# Patient Record
Sex: Male | Born: 1965
Health system: Southern US, Community
[De-identification: ages and names within clinical notes are randomized; demographics above are authoritative.]

## PROBLEM LIST (undated history)

## (undated) DIAGNOSIS — E785 Hyperlipidemia, unspecified: Secondary | ICD-10-CM

## (undated) DIAGNOSIS — I714 Abdominal aortic aneurysm, without rupture, unspecified: Secondary | ICD-10-CM

## (undated) DIAGNOSIS — I4510 Unspecified right bundle-branch block: Secondary | ICD-10-CM

## (undated) DIAGNOSIS — R011 Cardiac murmur, unspecified: Secondary | ICD-10-CM

## (undated) DIAGNOSIS — I10 Essential (primary) hypertension: Secondary | ICD-10-CM

## (undated) DIAGNOSIS — N433 Hydrocele, unspecified: Secondary | ICD-10-CM

## (undated) HISTORY — PX: CARDIOVASCULAR STRESS TEST: SHX262

## (undated) HISTORY — PX: TRANSTHORACIC ECHOCARDIOGRAM: SHX275

---

## 1998-11-16 ENCOUNTER — Emergency Department (HOSPITAL_COMMUNITY): Admission: EM | Admit: 1998-11-16 | Discharge: 1998-11-16 | Payer: Self-pay | Admitting: *Deleted

## 2014-08-10 ENCOUNTER — Emergency Department (HOSPITAL_COMMUNITY): Payer: No Typology Code available for payment source

## 2014-08-10 ENCOUNTER — Encounter (HOSPITAL_COMMUNITY): Payer: Self-pay | Admitting: Family Medicine

## 2014-08-10 ENCOUNTER — Emergency Department (HOSPITAL_COMMUNITY)
Admission: EM | Admit: 2014-08-10 | Discharge: 2014-08-10 | Disposition: A | Discharge status: Home / Self Care | Payer: No Typology Code available for payment source | Attending: Emergency Medicine | Admitting: Emergency Medicine

## 2014-08-10 DIAGNOSIS — N433 Hydrocele, unspecified: Secondary | ICD-10-CM | POA: Insufficient documentation

## 2014-08-10 DIAGNOSIS — N50819 Testicular pain, unspecified: Secondary | ICD-10-CM

## 2014-08-10 DIAGNOSIS — N508 Other specified disorders of male genital organs: Secondary | ICD-10-CM | POA: Diagnosis present

## 2014-08-10 DIAGNOSIS — F419 Anxiety disorder, unspecified: Secondary | ICD-10-CM | POA: Insufficient documentation

## 2014-08-10 DIAGNOSIS — Z720 Tobacco use: Secondary | ICD-10-CM | POA: Diagnosis not present

## 2014-08-10 LAB — URINALYSIS, ROUTINE W REFLEX MICROSCOPIC
Bilirubin Urine: NEGATIVE
Glucose, UA: NEGATIVE mg/dL
Hgb urine dipstick: NEGATIVE
Ketones, ur: NEGATIVE mg/dL
Leukocytes, UA: NEGATIVE
Nitrite: NEGATIVE
Protein, ur: NEGATIVE mg/dL
Specific Gravity, Urine: 1.016 (ref 1.005–1.030)
Urobilinogen, UA: 0.2 mg/dL (ref 0.0–1.0)
pH: 5 (ref 5.0–8.0)

## 2014-08-10 LAB — I-STAT CHEM 8, ED
BUN: 11 mg/dL (ref 6–20)
Calcium, Ion: 1.24 mmol/L — ABNORMAL HIGH (ref 1.12–1.23)
Chloride: 105 mmol/L (ref 101–111)
Creatinine, Ser: 0.9 mg/dL (ref 0.61–1.24)
Glucose, Bld: 139 mg/dL — ABNORMAL HIGH (ref 65–99)
HCT: 50 % (ref 39.0–52.0)
Hemoglobin: 17 g/dL (ref 13.0–17.0)
Potassium: 4 mmol/L (ref 3.5–5.1)
Sodium: 141 mmol/L (ref 135–145)
TCO2: 21 mmol/L (ref 0–100)

## 2014-08-10 IMAGING — US US SCROTUM
1 series · 14 of 25 positions shown · non-contrast
Comparison: None.

CLINICAL DATA: Left testicular pain and swelling for 4 months.

EXAM:
SCROTAL ULTRASOUND
DOPPLER ULTRASOUND OF THE TESTICLES
TECHNIQUE: Complete ultrasound examination of the testicles, epididymis, and
other scrotal structures was performed. Color and spectral Doppler
ultrasound were also utilized to evaluate blood flow to the
testicles.

[Series 1: us scrotum · 0.06mm/px · 69 acquisitions, 14 frames shown]
[im 1/69]
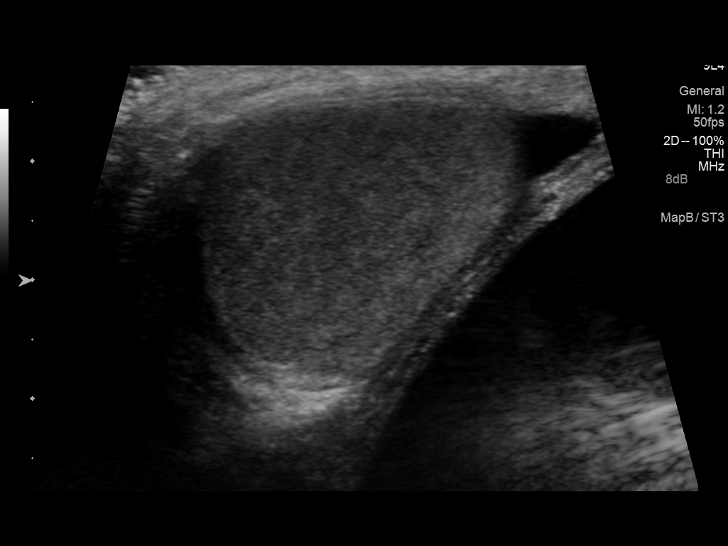
[im 6/69]
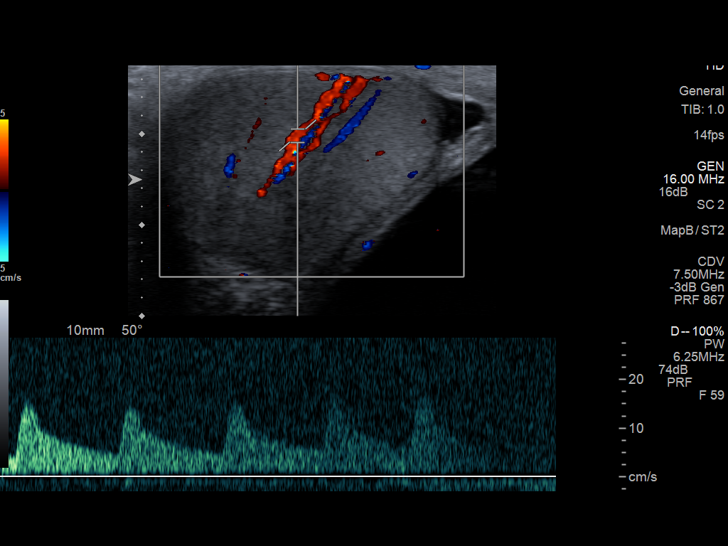
[im 12/69]
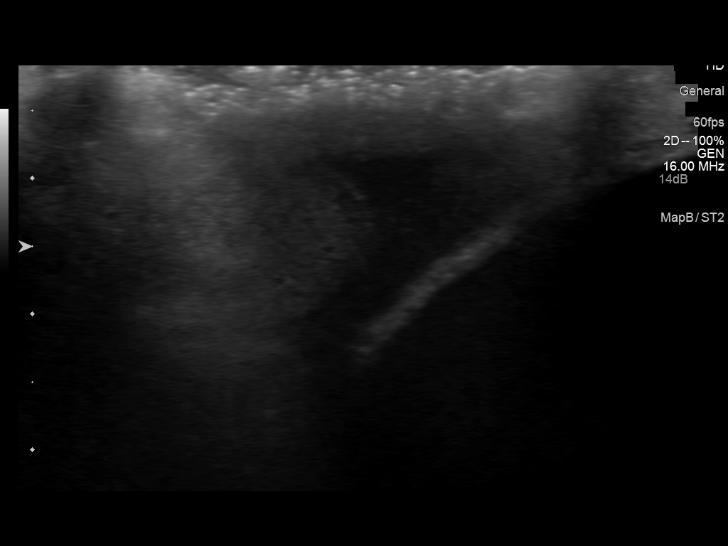
[im 18/69]
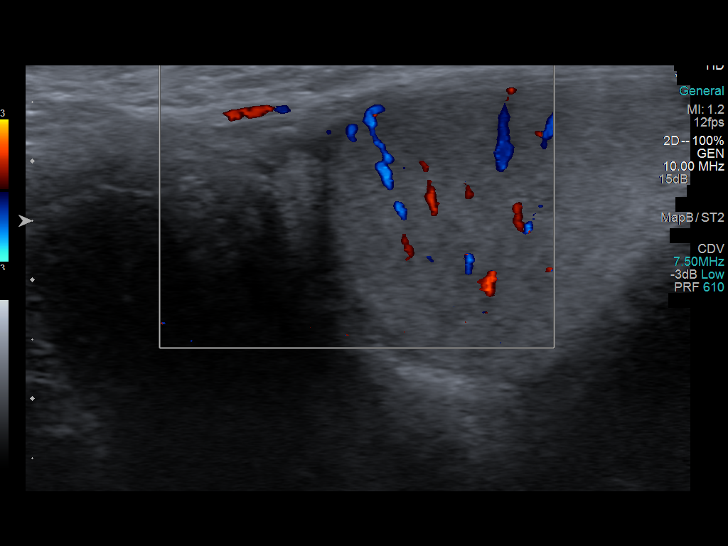
[im 23/69]
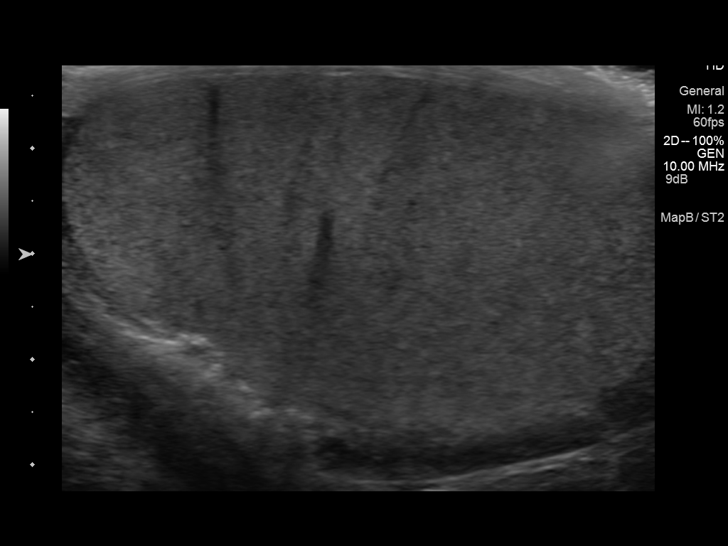
[im 26/69]
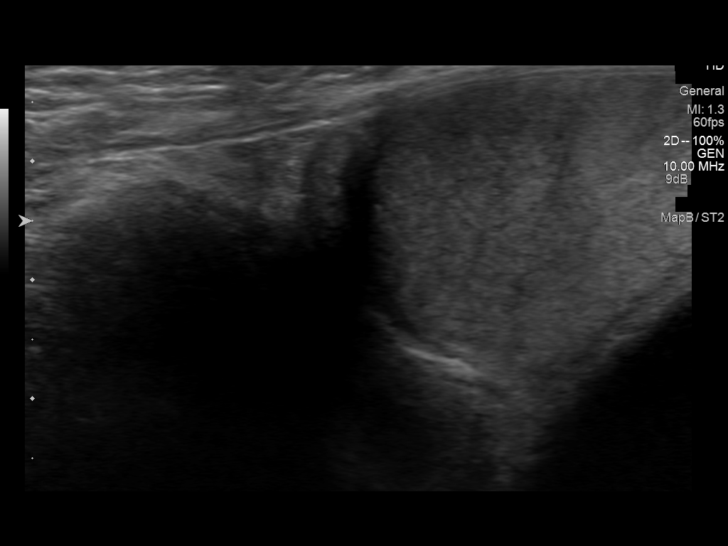
[im 32/69]
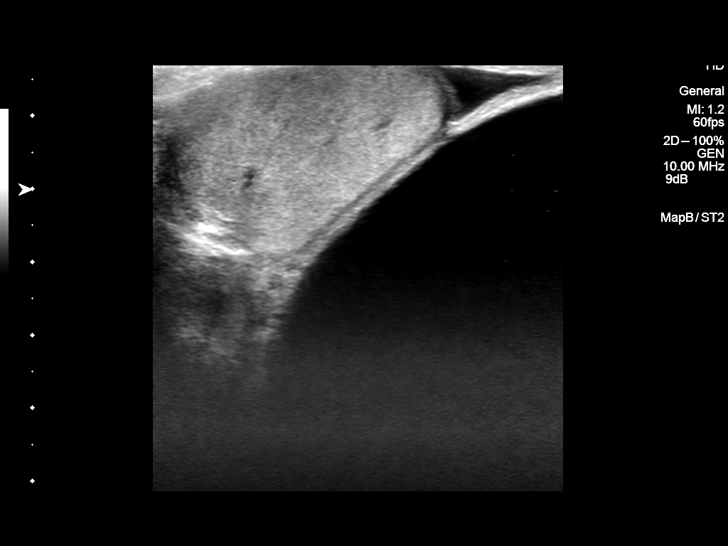
[im 37/69]
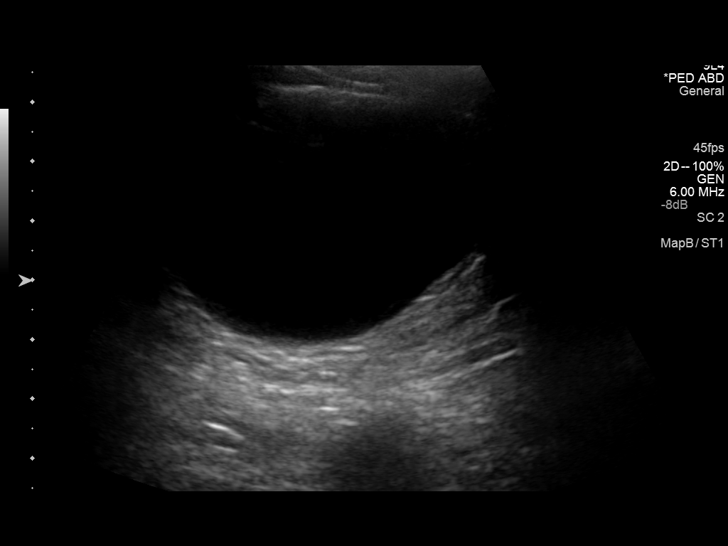
[im 43/69]
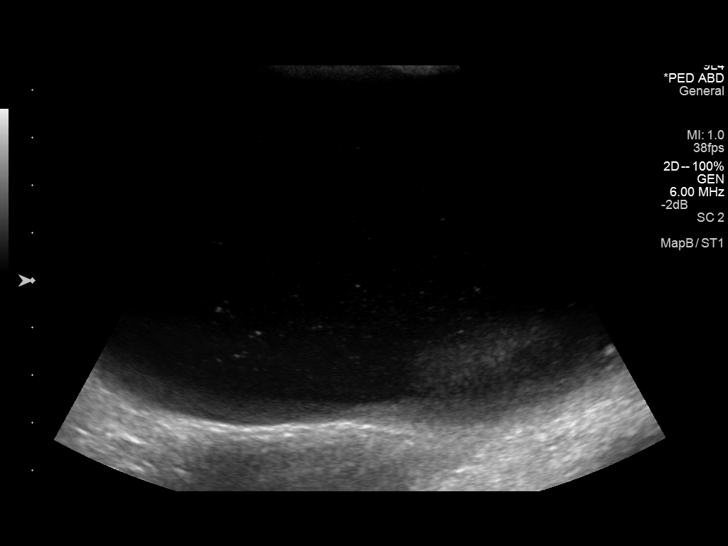
[im 46/69]
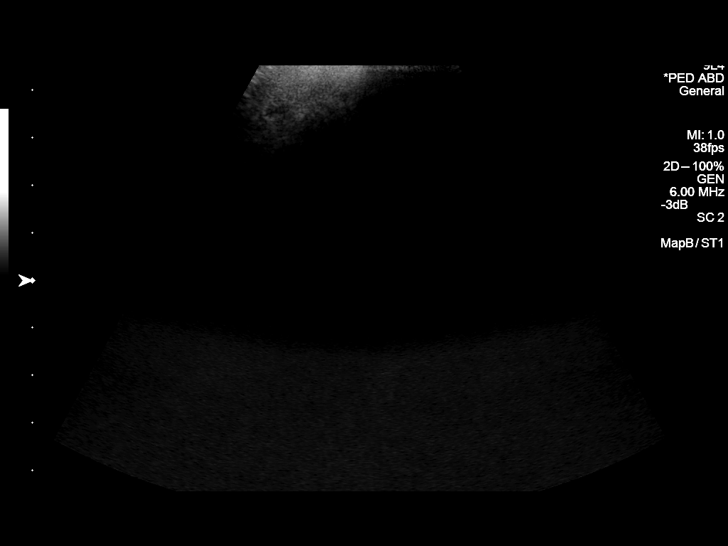
[im 52/69]
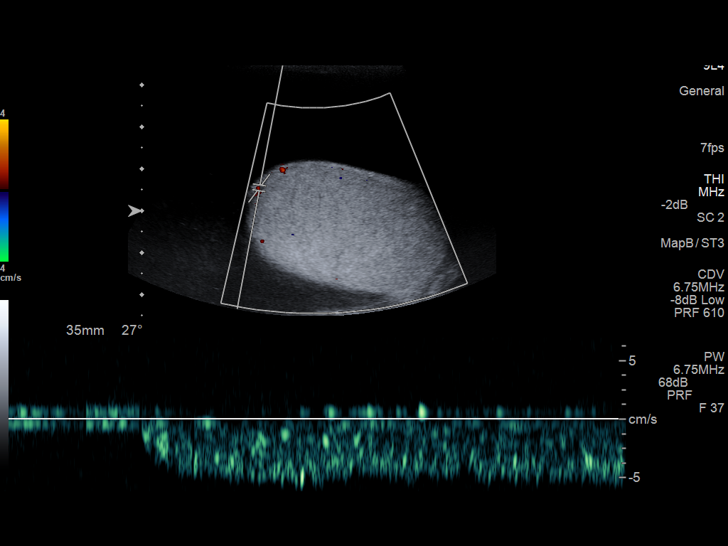
[im 57/69]
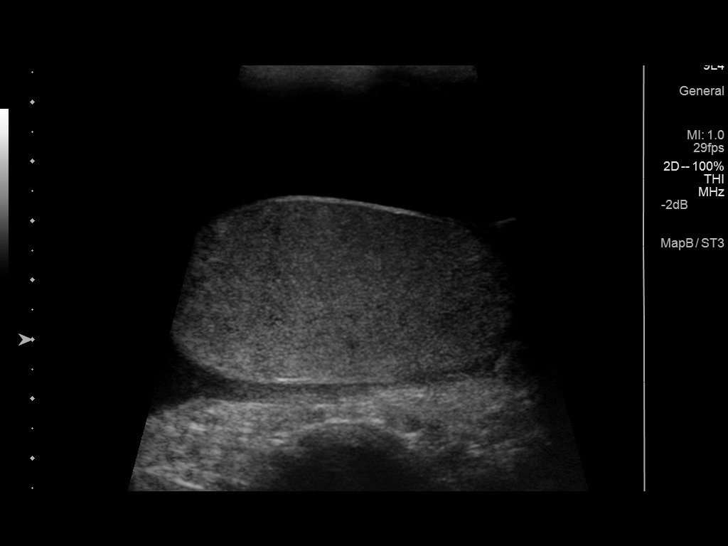
[im 63/69]
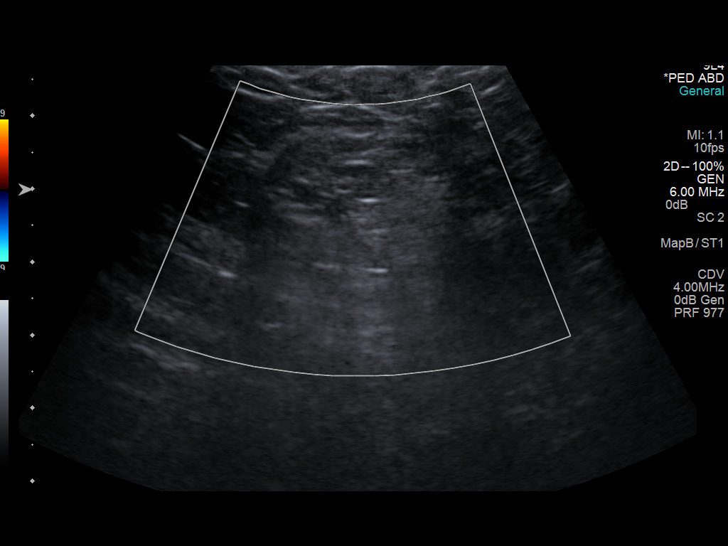
[im 69/69]
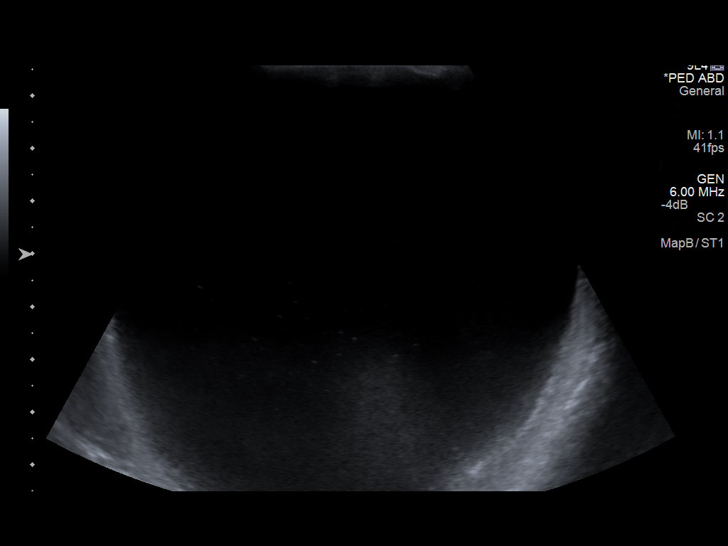

[14 of 25 positions shown; findings below may reference images not displayed]

FINDINGS: Right testicle

Measurements: 5.5 x 3.5 x 3 cm.. No mass or microlithiasis
visualized. Positive arterial and venous flow are identified.

Left testicle

Measurements: 6 x 3.3 x 3.4 cm.. No mass or microlithiasis
visualized. Positive arterial and venous flow are identified.

Right epididymis: There is a 4 mm cyst in the right epididymal head.

Left epididymis:  Not well seen.

Hydrocele: There is a large left hydrocele with minimal internal
echoes.

Varicocele:  None visualized.

Pulsed Doppler interrogation of both testes demonstrates normal low
resistance arterial and venous waveforms bilaterally.
IMPRESSION: Normal bilateral testis. There is no ultrasound evidence of
testicular torsion.

Large left hydrocele with minimal internal echoes.

## 2014-08-10 NOTE — ED Provider Notes (Signed)
CSN: [id number]     Arrival date & time 08/10/14  1020 History   First MD Initiated Contact with Patient 08/10/14 1025     Chief Complaint  Patient presents with  . Testicle Pain     (Consider location/radiation/quality/duration/timing/severity/associated sxs/prior Treatment) HPI Pt is a 49yo male presenting to ED with reports of several month long hx of left testicular swelling.  Pt states he has not f/u with a medical provider as he does not have a PCP and has been too nervous to come to ED.  Now, pt states he has mild aching dull pain with movement of left testicle.  Scrotal swelling is constant, and has gradually worsened. Pt states he thought it would get better but nothing seems to make symptoms better or worse.  Swelling does not improve when pt lies flat.  Pt also reports having dark urine in the morning but states his urine clears up by the end of the day.  Denies pain with urination. Denies penile pain or discharge.  Pt states he works in [employer] but does not recall any injury to his groin.  States he does drink a lot of water. Denies hx of DM.   History reviewed. No pertinent past medical history. History reviewed. No pertinent past surgical history. History reviewed. No pertinent family history. History  Substance Use Topics  . Smoking status: Current Every Day Smoker  . Smokeless tobacco: Not on file  . Alcohol Use: Yes    Review of Systems  Constitutional: Negative for fever, chills, appetite change, fatigue and unexpected weight change.  Respiratory: Negative for cough and shortness of breath.   Cardiovascular: Negative for chest pain, palpitations and leg swelling.  Gastrointestinal: Negative for nausea, vomiting, abdominal pain and diarrhea.  Genitourinary: Positive for hematuria ("real dark urine"), scrotal swelling and testicular pain. Negative for dysuria, urgency, frequency, flank pain, discharge, penile swelling and penile pain.  Musculoskeletal: Negative for  myalgias and back pain.  All other systems reviewed and are negative.     Allergies  Review of patient's allergies indicates no known allergies.  Home Medications   Prior to Admission medications   Medication Sig Start Date End Date Taking? Authorizing Provider  acetaminophen (TYLENOL) 500 MG tablet Take 1,000 mg by mouth every 6 (six) hours as needed for mild pain or moderate pain.   Yes Historical Provider, MD   BP 167/104 mmHg  Pulse 58  Temp(Src) 98 F (36.7 C)  Resp 18  Ht 5\' 11"  (1.803 m)  Wt 228 lb (103.42 kg)  BMI 31.81 kg/m2  SpO2 98% Physical Exam  Constitutional: He is oriented to person, place, and time. He appears well-developed and well-nourished.  Pt sitting on side of exam bed, appears mildly anxious but otherwise well, non-toxic appearing   HENT:  Head: Normocephalic and atraumatic.  Eyes: EOM are normal.  Neck: Normal range of motion.  Cardiovascular: Normal rate.   Pulmonary/Chest: Effort normal. No respiratory distress.  Abdominal: Soft. He exhibits no distension. There is no tenderness.  Genitourinary:  Chaperoned exam.  Softball sized scrotal mass, penis covered due to extent of swelling. Skin around penis is able to be retracted, no phimosis or paraphymosis. No penile discharge or erythema.  Scrotal mass is firm, mild tenderness. No erythema or warmth of skin overlying testicles.   Musculoskeletal: Normal range of motion.  Neurological: He is alert and oriented to person, place, and time.  Skin: Skin is warm and dry.  Psychiatric: His behavior is normal. His mood  appears anxious.  Nursing note and vitals reviewed.   ED Course  Procedures (including critical care time) Labs Review Labs Reviewed  I-STAT CHEM 8, ED - Abnormal; Notable for the following:    Glucose, Bld 139 (*)    Calcium, Ion 1.24 (*)    All other components within normal limits  URINE CULTURE  URINALYSIS, ROUTINE W REFLEX MICROSCOPIC  GC/CHLAMYDIA PROBE AMP ([place])     Imaging Review US Scrotum  08/10/2014   CLINICAL DATA:  Left testicular pain and swelling for 4 months.  EXAM: SCROTAL ULTRASOUND  DOPPLER ULTRASOUND OF THE TESTICLES  TECHNIQUE: Complete ultrasound examination of the testicles, epididymis, and other scrotal structures was performed. Color and spectral Doppler ultrasound were also utilized to evaluate blood flow to the testicles.  COMPARISON:  None.  FINDINGS: Right testicle  Measurements: 5.5 x 3.5 x 3 cm. No mass or microlithiasis visualized. Positive arterial and venous flow are identified.  Left testicle  Measurements: 6 x 3.3 x 3.4 cm. No mass or microlithiasis visualized. Positive arterial and venous flow are identified.  Right epididymis: There is a 4 mm cyst in the right epididymal head.  Left epididymis:  Not well seen.  Hydrocele: There is a large left hydrocele with minimal internal echoes.  Varicocele:  None visualized.  Pulsed Doppler interrogation of both testes demonstrates normal low resistance arterial and venous waveforms bilaterally.  IMPRESSION: Normal bilateral testis. There is no ultrasound evidence of testicular torsion.  Large left hydrocele with minimal internal echoes.   Electronically Signed   By: Sherian Rein M.D.   On: 08/10/2014 12:36   [place] Art/ven Flow Abd Pelv Doppler  08/10/2014   CLINICAL DATA:  Left testicular pain and swelling for 4 months.  EXAM: SCROTAL ULTRASOUND  DOPPLER ULTRASOUND OF THE TESTICLES  TECHNIQUE: Complete ultrasound examination of the testicles, epididymis, and other scrotal structures was performed. Color and spectral Doppler ultrasound were also utilized to evaluate blood flow to the testicles.  COMPARISON:  None.  FINDINGS: Right testicle  Measurements: 5.5 x 3.5 x 3 cm. No mass or microlithiasis visualized. Positive arterial and venous flow are identified.  Left testicle  Measurements: 6 x 3.3 x 3.4 cm. No mass or microlithiasis visualized. Positive arterial and venous flow are identified.  Right  epididymis: There is a 4 mm cyst in the right epididymal head.  Left epididymis:  Not well seen.  Hydrocele: There is a large left hydrocele with minimal internal echoes.  Varicocele:  None visualized.  Pulsed Doppler interrogation of both testes demonstrates normal low resistance arterial and venous waveforms bilaterally.  IMPRESSION: Normal bilateral testis. There is no ultrasound evidence of testicular torsion.  Large left hydrocele with minimal internal echoes.   Electronically Signed   By: Sherian Rein M.D.   On: 08/10/2014 12:36     EKG Interpretation None      MDM   Final diagnoses:  Left hydrocele    Pt is a 49yo male presenting to ED with c/o gradually worsening scrotal swelling with mild pain for several months.  Pt also noted to have elevated BP of 195/112.  Pt has not f/u with a medical provider for several years. No known hx of HTN.  Pt also nervous about being in ED for current issue. Initially requested male provider but allowed this provider to complete workup as male providers with busy with critical patients.  Exam concerning for testicular cancer, significant for softball sized firm mass, mild tenderness. No erythema or warmth.  No evidence of underlying infection including cellulitis, abscess, or gangrene.  Pt does report dark urine, will get UA.  Pt denies fever, n/v/d. No hx of weight loss. Denies abdominal pain. Abdomen is soft, non-tender.  UA: unremarkable. Chem-8: unremarkable, Cr: 0.90. No evidence of ARF.  U/S: normal bilateral testis.  No ultrasound evidence of testicular torsion.  Large Left hydrocele with minimal internal echoes.  Discussed imaging with pt.  Strongly encouraged pt to call to schedule f/u appointment with Dr. Patsi Searsannenbaum, urology, for further evaluation and treatment of hydrocele. Also discussed with pt establishing PCP with [facility].  Return precautions provided. Pt verbalized understanding and agreement with tx plan.        Junius Finnerrin O'Malley,  PA-C 08/10/14 1259  Blake DivineJohn Wofford, MD 08/10/14 1319

## 2014-08-10 NOTE — ED Notes (Signed)
Pt sts left testicle swelling x a few months. sts some pain with movement.

## 2014-08-10 NOTE — Discharge Instructions (Signed)
Hydrocele, Adult Fluid can collect around the testicles. This fluid forms in a sac. This condition is called a hydrocele. The collected fluid causes swelling of the scrotum. Usually, it affects just one testicle. Most of the time, the condition does not cause pain. Sometimes, the hydrocele goes away on its own. Other times, surgery is needed to get rid of the fluid. CAUSES A hydrocele does not develop often. Different things can cause a hydrocele in a man, including:  Injury to the scrotum.  Infection.  X-ray of the area around the scrotum.  A tumor or cancer of the testicle.  Twisting of a testicle.  Decreased blood flow to the scrotum. SYMPTOMS   Swelling without pain. The hydrocele feels like a water-filled balloon.  Swelling with pain. This can occur if the hydrocele was caused by infection or twisting.  Mild discomfort in the scrotum.  The hydrocele may feel heavy.  Swelling that gets smaller when you lie down. DIAGNOSIS  Your caregiver will do a physical exam to decide if you have a hydrocele. This may include:  Asking questions about your overall health, today and in the past. Your caregiver may ask about any injuries, X-rays, or infections.  Pushing on your abdomen or asking you to change positions to see if the size of the hydrocele changes.  Shining a light through the scrotum (transillumination) to see if the fluid inside the scrotum is clear.  Blood tests and urine tests to check for infection.  Imaging studies that take pictures of the scrotum and testicles. TREATMENT  Treatment depends in part on what caused the condition. Options include:  Watchful waiting. Your caregiver checks the hydrocele every so often.  Different surgeries to drain the fluid.  A needle may be put into the scrotum to drain fluid (needle aspiration). Fluid often returns after this type of treatment.  A cut (incision) may be made in the scrotum to remove the fluid sac  (hydrocelectomy).  An incision may be made in the groin to repair a hydrocele that has contact with abdominal fluids (communicating hydrocele).  Medicines to treat an infection (antibiotics). HOME CARE INSTRUCTIONS  What you need to do at home may depend on the cause of the hydrocele and type of treatment. In general:  Take all medicine as directed by your caregiver. Follow the directions carefully.  Ask your caregiver if there is anything you should not do while you recover (activities, lifting, work, sex).  If you had surgery to repair a communicating hydrocele, recovery time may vary. Ask you caregiver about your recovery time.  Avoid heavy lifting for 4 to 6 weeks.  If you had an incision on the scrotum or groin, wash it for 2 to 3 days after surgery. Do this as long as the skin is closed and there are no gaps in the wound. Wash gently, and avoid rubbing the incision.  Keep all follow-up appointments. SEEK MEDICAL CARE IF:   Your scrotum seems to be getting larger.  The area becomes more and more uncomfortable. SEEK IMMEDIATE MEDICAL CARE IF:  You have a fever. Document Released: 09/01/2009 Document Revised: 01/02/2013 Document Reviewed: 09/01/2009 [employer] Patient Information 2015 [employer]. This information is not intended to replace advice given to you by your health care provider. Make sure you discuss any questions you have with your health care provider.  

## 2014-08-11 LAB — URINE CULTURE
Colony Count: NO GROWTH
Culture: NO GROWTH

## 2014-09-01 ENCOUNTER — Other Ambulatory Visit: Payer: Self-pay | Admitting: Urology

## 2014-09-14 ENCOUNTER — Ambulatory Visit (INDEPENDENT_AMBULATORY_CARE_PROVIDER_SITE_OTHER): Payer: No Typology Code available for payment source | Admitting: Family Medicine

## 2014-09-14 VITALS — BP 192/118 | HR 83 | Temp 98.3°F | Resp 20 | Ht 70.0 in | Wt 220.1 lb

## 2014-09-14 DIAGNOSIS — R010 Benign and innocent cardiac murmurs: Secondary | ICD-10-CM | POA: Diagnosis not present

## 2014-09-14 DIAGNOSIS — R7309 Other abnormal glucose: Secondary | ICD-10-CM

## 2014-09-14 DIAGNOSIS — Z8249 Family history of ischemic heart disease and other diseases of the circulatory system: Secondary | ICD-10-CM | POA: Diagnosis not present

## 2014-09-14 DIAGNOSIS — I10 Essential (primary) hypertension: Secondary | ICD-10-CM

## 2014-09-14 DIAGNOSIS — F1010 Alcohol abuse, uncomplicated: Secondary | ICD-10-CM

## 2014-09-14 DIAGNOSIS — Z720 Tobacco use: Secondary | ICD-10-CM | POA: Diagnosis not present

## 2014-09-14 DIAGNOSIS — R011 Cardiac murmur, unspecified: Secondary | ICD-10-CM

## 2014-09-14 DIAGNOSIS — N433 Hydrocele, unspecified: Secondary | ICD-10-CM

## 2014-09-14 LAB — LIPID PANEL
CHOL/HDL RATIO: 4.6 ratio
CHOLESTEROL: 176 mg/dL (ref 0–200)
HDL: 38 mg/dL — AB (ref >=40)
LDL CALC: 115 mg/dL — AB (ref 0–99)
Triglycerides: 115 mg/dL (ref <150)
VLDL: 23 mg/dL (ref 0–40)

## 2014-09-14 LAB — POCT GLYCOSYLATED HEMOGLOBIN (HGB A1C): HEMOGLOBIN A1C: 5.4

## 2014-09-14 LAB — COMPREHENSIVE METABOLIC PANEL
ALK PHOS: 80 U/L (ref 39–117)
ALT: 14 U/L (ref 0–53)
AST: 12 U/L (ref 0–37)
Albumin: 4 g/dL (ref 3.5–5.2)
BUN: 11 mg/dL (ref 6–23)
CHLORIDE: 107 meq/L (ref 96–112)
CO2: 22 meq/L (ref 19–32)
Calcium: 9.6 mg/dL (ref 8.4–10.5)
Creat: 0.87 mg/dL (ref 0.50–1.35)
GLUCOSE: 95 mg/dL (ref 70–99)
Potassium: 4.3 mEq/L (ref 3.5–5.3)
Sodium: 140 mEq/L (ref 135–145)
Total Bilirubin: 0.7 mg/dL (ref 0.2–1.2)
Total Protein: 7.4 g/dL (ref 6.0–8.3)

## 2014-09-14 LAB — GLUCOSE, POCT (MANUAL RESULT ENTRY): POC Glucose: 97 mg/dl (ref 70–99)

## 2014-09-14 LAB — LIPASE: Lipase: 73 U/L (ref 0–75)

## 2014-09-14 MED ORDER — HYDROCHLOROTHIAZIDE 25 MG PO TABS: 12.5000 mg | ORAL_TABLET | Freq: Every day | ORAL | Status: DC

## 2014-09-14 MED ORDER — LISINOPRIL 40 MG PO TABS: 40.0000 mg | ORAL_TABLET | Freq: Every day | ORAL | Status: DC

## 2014-09-14 NOTE — Patient Instructions (Signed)
Acute Coronary Syndrome Acute coronary syndrome (ACS) is an urgent problem in which the blood and oxygen supply to the heart is critically deficient. ACS requires hospitalization because one or more coronary arteries may be blocked. ACS represents a range of conditions including:  Previous angina that is now unstable, lasts longer, happens at rest, or is more intense.  A heart attack, with heart muscle cell injury and death. There are three vital coronary arteries that supply the heart muscle with blood and oxygen so that it can pump blood effectively. If blockages to these arteries develop, blood flow to the heart muscle is reduced. If the heart does not get enough blood, angina may occur as the first warning sign. SYMPTOMS   The most common signs of angina include:  Tightness or squeezing in the chest.  Feeling of heaviness on the chest.  Discomfort in the arms, neck, back, or jaw.  Shortness of breath and nausea.  Cold, wet skin.  Angina is usually brought on by physical effort or excitement which increase the oxygen needs of the heart. These states increase the blood flow needs of the heart beyond what can be delivered.  Other symptoms that are not as common include:  Fatigue  Unexplained feelings of nervousness or anxiety  Weakness  Diarrhea  Sometimes, you may not have noticed any symptoms at all but still suffered a cardiac injury. TREATMENT   Medicines to help discomfort may include nitroglycerin (nitro) in the form of tablets or a spray for rapid relief, or longer-acting forms such as cream, patches, or capsules. (Be aware that there are many side effects and possible interactions with other drugs).  Other medicines may be used to help the heart pump better.  Procedures to open blocked arteries including angioplasty or stent placement to keep the arteries open.  Open heart surgery may be needed when there are many blockages or they are in critical locations that  are best treated with surgery. HOME CARE INSTRUCTIONS   Do not use any tobacco products including cigarettes, chewing tobacco, or electronic cigarettes.  Take one baby or adult aspirin daily, if your health care provider advises. This helps reduce the risk of a heart attack.  It is very important that you follow the angina treatment prescribed by your health care provider. Make arrangements for proper follow-up care.  Eat a heart healthy diet with salt and fat restrictions as advised.  Regular exercise is good for you as long as it does not cause discomfort. Do not begin any new type of exercise until you check with your health care provider.  If you are overweight, you should lose weight.  Try to maintain normal blood lipid levels.  Keep your blood pressure under control as recommended by your health care provider.  You should tell your health care provider right away about any increase in the severity or frequency of your chest discomfort or angina attacks. When you have angina, you should stop what you are doing and sit down. This may bring relief in 3 to 5 minutes. If your health care provider has prescribed nitro, take it as directed.  If your health care provider has given you a follow-up appointment, it is very important to keep that appointment. Not keeping the appointment could result in a chronic or permanent injury, pain, and disability. If there is any problem keeping the appointment, you must call back to this facility for assistance. SEEK IMMEDIATE MEDICAL CARE IF:   You develop nausea, vomiting, or shortness  of breath.  You feel faint, lightheaded, or pass out.  Your chest discomfort gets worse.  You are sweating or experience sudden profound fatigue.  You do not get relief of your chest pain after 3 doses of nitro.  Your discomfort lasts longer than 15 minutes. MAKE SURE YOU:   Understand these instructions.  Will watch your condition.  Will get help right  away if you are not doing well or get worse.  Take all medicines as directed by your health care provider. Document Released: 03/14/2005 Document Revised: 03/19/2013 Document Reviewed: 07/16/2013 [facility] Patient Information 2015 [place], [place]. This information is not intended to replace advice given to you by your health care provider. Make sure you discuss any questions you have with your health care provider.   Alcohol Use Disorder Alcohol use disorder is a mental disorder. It is not a one-time incident of heavy drinking. Alcohol use disorder is the excessive and uncontrollable use of alcohol over time that leads to problems with functioning in one or more areas of daily living. People with this disorder risk harming themselves and others when they drink to excess. Alcohol use disorder also can cause other mental disorders, such as mood and anxiety disorders, and serious physical problems. People with alcohol use disorder often misuse other drugs.  Alcohol use disorder is common and widespread. Some people with this disorder drink alcohol to cope with or escape from negative life events. Others drink to relieve chronic pain or symptoms of mental illness. People with a family history of alcohol use disorder are at higher risk of losing control and using alcohol to excess.  SYMPTOMS  Signs and symptoms of alcohol use disorder may include the following:   Consumption ofalcohol inlarger amounts or over a longer period of time than intended.  Multiple unsuccessful attempts to cutdown or control alcohol use.   A great deal of time spent obtaining alcohol, using alcohol, or recovering from the effects of alcohol (hangover).  A strong desire or urge to use alcohol (cravings).   Continued use of alcohol despite problems at work, school, or home because of alcohol use.   Continued use of alcohol despite problems in relationships because of alcohol use.  Continued use of alcohol in  situations when it is physically hazardous, such as driving a car.  Continued use of alcohol despite awareness of a physical or psychological problem that is likely related to alcohol use. Physical problems related to alcohol use can involve the brain, heart, liver, stomach, and intestines. Psychological problems related to alcohol use include intoxication, depression, anxiety, psychosis, delirium, and dementia.   The need for increased amounts of alcohol to achieve the same desired effect, or a decreased effect from the consumption of the same amount of alcohol (tolerance).  Withdrawal symptoms upon reducing or stopping alcohol use, or alcohol use to reduce or avoid withdrawal symptoms. Withdrawal symptoms include:  Racing heart.  Hand tremor.  Difficulty sleeping.  Nausea.  Vomiting.  Hallucinations.  Restlessness.  Seizures. DIAGNOSIS Alcohol use disorder is diagnosed through an assessment by your health care provider. Your health care provider may start by asking three or four questions to screen for excessive or problematic alcohol use. To confirm a diagnosis of alcohol use disorder, at least two symptoms must be present within a 87-month period. The severity of alcohol use disorder depends on the number of symptoms:  Mild--two or three.  Moderate--four or five.  Severe--six or more. Your health care provider may perform a physical exam  or use results from lab tests to see if you have physical problems resulting from alcohol use. Your health care provider may refer you to a mental health professional for evaluation. TREATMENT  Some people with alcohol use disorder are able to reduce their alcohol use to low-risk levels. Some people with alcohol use disorder need to quit drinking alcohol. When necessary, mental health professionals with specialized training in substance use treatment can help. Your health care provider can help you decide how severe your alcohol use disorder is  and what type of treatment you need. The following forms of treatment are available:   Detoxification. Detoxification involves the use of prescription medicines to prevent alcohol withdrawal symptoms in the first week after quitting. This is important for people with a history of symptoms of withdrawal and for heavy drinkers who are likely to have withdrawal symptoms. Alcohol withdrawal can be dangerous and, in severe cases, cause death. Detoxification is usually provided in a hospital or in-patient substance use treatment facility.  Counseling or talk therapy. Talk therapy is provided by substance use treatment counselors. It addresses the reasons people use alcohol and ways to keep them from drinking again. The goals of talk therapy are to help people with alcohol use disorder find healthy activities and ways to cope with life stress, to identify and avoid triggers for alcohol use, and to handle cravings, which can cause relapse.  Medicines.Different medicines can help treat alcohol use disorder through the following actions:  Decrease alcohol cravings.  Decrease the positive reward response felt from alcohol use.  Produce an uncomfortable physical reaction when alcohol is used (aversion therapy).  Support groups. Support groups are run by people who have quit drinking. They provide emotional support, advice, and guidance. These forms of treatment are often combined. Some people with alcohol use disorder benefit from intensive combination treatment provided by specialized substance use treatment centers. Both inpatient and outpatient treatment programs are available. Document Released: 04/21/2004 Document Revised: 07/29/2013 Document Reviewed: 06/21/2012 [facility] Patient Information 2015 [place], [place]. This information is not intended to replace advice given to you by your health care provider. Make sure you discuss any questions you have with your health care  provider.  Hypertension Hypertension, commonly called high blood pressure, is when the force of blood pumping through your arteries is too strong. Your arteries are the blood vessels that carry blood from your heart throughout your body. A blood pressure reading consists of a higher number over a lower number, such as 110/72. The higher number (systolic) is the pressure inside your arteries when your heart pumps. The lower number (diastolic) is the pressure inside your arteries when your heart relaxes. Ideally you want your blood pressure below 120/80. Hypertension forces your heart to work harder to pump blood. Your arteries may become narrow or stiff. Having hypertension puts you at risk for heart disease, stroke, and other problems.  RISK FACTORS Some risk factors for high blood pressure are controllable. Others are not.  Risk factors you cannot control include:   Race. You may be at higher risk if you are African American.  Age. Risk increases with age.  Gender. Men are at higher risk than women before age 46 years. After age 21, women are at higher risk than men. Risk factors you can control include:  Not getting enough exercise or physical activity.  Being overweight.  Getting too much fat, sugar, calories, or salt in your diet.  Drinking too much alcohol. SIGNS AND SYMPTOMS Hypertension does not  usually cause signs or symptoms. Extremely high blood pressure (hypertensive crisis) may cause headache, anxiety, shortness of breath, and nosebleed. DIAGNOSIS  To check if you have hypertension, your health care provider will measure your blood pressure while you are seated, with your arm held at the level of your heart. It should be measured at least twice using the same arm. Certain conditions can cause a difference in blood pressure between your right and left arms. A blood pressure reading that is higher than normal on one occasion does not mean that you need treatment. If one blood  pressure reading is high, ask your health care provider about having it checked again. TREATMENT  Treating high blood pressure includes making lifestyle changes and possibly taking medicine. Living a healthy lifestyle can help lower high blood pressure. You may need to change some of your habits. Lifestyle changes may include:  Following the DASH diet. This diet is high in fruits, vegetables, and whole grains. It is low in salt, red meat, and added sugars.  Getting at least 2 hours of brisk physical activity every week.  Losing weight if necessary.  Not smoking.  Limiting alcoholic beverages.  Learning ways to reduce stress. If lifestyle changes are not enough to get your blood pressure under control, your health care provider may prescribe medicine. You may need to take more than one. Work closely with your health care provider to understand the risks and benefits. HOME CARE INSTRUCTIONS  Have your blood pressure rechecked as directed by your health care provider.   Take medicines only as directed by your health care provider. Follow the directions carefully. Blood pressure medicines must be taken as prescribed. The medicine does not work as well when you skip doses. Skipping doses also puts you at risk for problems.   Do not smoke.   Monitor your blood pressure at home as directed by your health care provider. SEEK MEDICAL CARE IF:   You think you are having a reaction to medicines taken.  You have recurrent headaches or feel dizzy.  You have swelling in your ankles.  You have trouble with your vision. SEEK IMMEDIATE MEDICAL CARE IF:  You develop a severe headache or confusion.  You have unusual weakness, numbness, or feel faint.  You have severe chest or abdominal pain.  You vomit repeatedly.  You have trouble breathing. MAKE SURE YOU:   Understand these instructions.  Will watch your condition.  Will get help right away if you are not doing well or get  worse. Document Released: 03/14/2005 Document Revised: 07/29/2013 Document Reviewed: 01/04/2013 [facility] Patient Information 2015 [place], [place]. This information is not intended to replace advice given to you by your health care provider. Make sure you discuss any questions you have with your health care provider.

## 2014-09-14 NOTE — Progress Notes (Signed)
Subjective:   This chart was scribed for Randall Sorenson, MD by Andrew Au, ED Scribe. This patient was seen in room 1 and the patient's care was started at 9:51 AM.   Patient ID: Randall Ferrell, male    DOB: 1965/10/01, 49 y.o.   MRN: [id number]  HPI   Chief Complaint  Patient presents with  . Hypertension    elevated BP x 4 wks.  establish care today   Pt is scheduled for hydrocelectomy in 11 days. Pt was seen in ED 1 month ago with a BP 167/104 but was having extensive scrotal swelling and pain. Pt does not have PCP and was isntructed to establish care with [facility] but does not have an appointment with them. Basic metabolic panel which showed normal renal function, glucose was slightly elevated.   HPI Comments: Randall Ferrell is a 49 y.o. male who presents to the [facility] complaining of elevated BP. Pt denies taking BP medication in the past. Pt has SOB. Pt is a smoker but states he only smokes when he drinks. Pt drinks a 12 pack of beer 2 days a week. He reports intermittent abdominal pain but states he has not had abdominal pain as of lately. Pt denies CP, HA, visual changes, and leg swelling. He denies trouble falling asleep but does tend to wake up during the night.  Pt has family hx of HTN including his mother, brother and sister.  He reports family hx of MI and states his brother had MI at 58 y.o and father 60. Pt is fasting  Pt plans to establish care here at [facility].   Prior to Admission medications   Medication Sig Start Date End Date Taking? Authorizing Provider  acetaminophen (TYLENOL) 500 MG tablet Take 1,000 mg by mouth every 6 (six) hours as needed for mild pain or moderate pain.   Yes Historical Provider, MD  naproxen sodium (ANAPROX) 220 MG tablet Take 220 mg by mouth 2 (two) times daily with a meal.   Yes Historical Provider, MD   Review of Systems  Eyes: Negative for visual disturbance.  Respiratory: Positive for shortness of breath.     Cardiovascular: Negative for chest pain and leg swelling.  Neurological: Negative for headaches.  Psychiatric/Behavioral: Negative for sleep disturbance.     Objective:   Physical Exam  Constitutional: He is oriented to person, place, and time. He appears well-developed and well-nourished. No distress.  HENT:  Head: Normocephalic and atraumatic.  Eyes: Conjunctivae and EOM are normal.  Neck: Neck supple.  Cardiovascular: Normal rate and regular rhythm.   Murmur heard.  Systolic ( right upper sternal border) murmur is present with a grade of 3/6  Pulmonary/Chest: Effort normal and breath sounds normal.  Lungs clear to ascultation.  Musculoskeletal: Normal range of motion.  Neurological: He is alert and oriented to person, place, and time.  Skin: Skin is warm and dry.  Psychiatric: He has a normal mood and affect. His behavior is normal.  Nursing note and vitals reviewed.  Filed Vitals:   09/14/14 0947  BP: 192/118  Pulse: 83  Temp: 98.3 F (36.8 C)  TempSrc: Oral  Resp: 20  Height:  (1.778 m)  Weight: 220 lb 2 oz (99.848 kg)  SpO2: 100%   Results for orders placed or performed in visit on 09/14/14  POCT glucose (manual entry)  Result Value Ref Range   POC Glucose 97 70 - 99 mg/dl  POCT glycosylated hemoglobin (  Hb A1C)  Result Value Ref Range   Hemoglobin A1C 5.4    EKG reading by Dr. Clelia Croft. Bradycardia. Partial bundle branch lock abnormal. None prior for comparison.   Assessment & Plan:   1. Essential hypertension, malignant - discussed w/ pt that thers is a good chance hem any need to reschedule his upcoming   2. Elevated glucose - nml a1c  3. Alcohol abuse - concerned he could have an alcoholic cardiomyopathy - high risk pt w/ malignant HT, abnml EKG, new murmur, +fam hx, alcohol and tob abuse, and medication non-compliance so stat cardiology eval and recommend to pt that he obtain surgical clearance prior to proceeding  4. Undiagnosed cardiac murmurs - none  known prior - needs eval prior to surg   5. Hydrocele in adult   6. Tobacco abuse   7. Family history of early CAD     Orders Placed This Encounter  Procedures  . Comprehensive metabolic panel    Order Specific Question:  Has the patient fasted?    Answer:  Yes  . Lipid panel    Order Specific Question:  Has the patient fasted?    Answer:  Yes  . PSA  . Lipase  . Ambulatory referral to Cardiology    Referral Priority:  Urgent    Referral Type:  Consultation    Referral Reason:  Specialty Services Required    Requested Specialty:  Cardiology    Number of Visits Requested:  1  . POCT glucose (manual entry)  . POCT glycosylated hemoglobin (Hb A1C)  . EKG 12-Lead    Meds ordered this encounter  Medications  . naproxen sodium (ANAPROX) 220 MG tablet    Sig: Take 220 mg by mouth 2 (two) times daily with a meal.  . lisinopril (PRINIVIL,ZESTRIL) 40 MG tablet    Sig: Take 1 tablet (40 mg total) by mouth daily.    Dispense:  30 tablet    Refill:  0  . hydrochlorothiazide (HYDRODIURIL) 25 MG tablet    Sig: Take 0.5 tablets (12.5 mg total) by mouth daily.    Dispense:  30 tablet    Refill:  0   Over 40 min spent in face-to-face evaluation of and consultation with patient and coordination of care.  Over 50% of this time was spent counseling this patient.   I personally performed the services described in this documentation, which was scribed in my presence. The recorded information has been reviewed and considered, and addended by me as needed.  Randall Sorenson, MD MPH

## 2014-09-15 LAB — PSA: PSA: 0.8 ng/mL (ref <=4.00)

## 2014-09-26 ENCOUNTER — Encounter (HOSPITAL_BASED_OUTPATIENT_CLINIC_OR_DEPARTMENT_OTHER): Admission: RE | Payer: Self-pay | Source: Ambulatory Visit

## 2014-09-26 ENCOUNTER — Ambulatory Visit (HOSPITAL_BASED_OUTPATIENT_CLINIC_OR_DEPARTMENT_OTHER)
Admission: RE | Admit: 2014-09-26 | Payer: No Typology Code available for payment source | Source: Ambulatory Visit | Admitting: Urology

## 2014-09-26 SURGERY — HYDROCELECTOMY
Anesthesia: General | Laterality: Left

## 2014-10-09 ENCOUNTER — Other Ambulatory Visit: Payer: Self-pay | Admitting: Family Medicine

## 2015-01-19 ENCOUNTER — Other Ambulatory Visit: Payer: Self-pay | Admitting: Urology

## 2015-01-23 ENCOUNTER — Encounter (HOSPITAL_BASED_OUTPATIENT_CLINIC_OR_DEPARTMENT_OTHER): Payer: Self-pay | Admitting: *Deleted

## 2015-01-28 ENCOUNTER — Encounter (HOSPITAL_BASED_OUTPATIENT_CLINIC_OR_DEPARTMENT_OTHER): Payer: Self-pay | Admitting: *Deleted

## 2015-01-28 NOTE — Progress Notes (Addendum)
NPO AFTER MN.  ARRIVE AT 0900.  NEEDS CBC, BMET, PT/INR, AND PTT.  CALLED [facility] AND HAD MESSAGE SENT TO DR Sherryl BartersBUDZYN ABOUT UA/CULTURE SINCE PT STATES HAD NOT BEEN TO OFFICE TO GET THIS DONE AND ALSO STATES UNABLE TO GET GO GET DONE UNTIL THIS Friday 01-30-2015 AT 1500. AWAIT DR BUDZYN ANSWER.  PT WILL TAKE NORVASC AND COZAAR AM DOS  W/ SIPS OF WATER.  CURRENT EKG, LOV NOTE, STRESS TEST AND ECHO TO BE FAXED FROM DR Jacinto HalimGANJI.  RECEIVED CALL BACK FROM DR BUDZYN OFFICE, HE STATED UA NOT NEEDED SO D/C ORDER.

## 2015-01-29 ENCOUNTER — Encounter (HOSPITAL_BASED_OUTPATIENT_CLINIC_OR_DEPARTMENT_OTHER): Payer: Self-pay | Admitting: *Deleted

## 2015-02-02 ENCOUNTER — Ambulatory Visit (HOSPITAL_BASED_OUTPATIENT_CLINIC_OR_DEPARTMENT_OTHER): Payer: No Typology Code available for payment source | Admitting: Anesthesiology

## 2015-02-02 ENCOUNTER — Encounter (HOSPITAL_BASED_OUTPATIENT_CLINIC_OR_DEPARTMENT_OTHER): Payer: Self-pay | Admitting: *Deleted

## 2015-02-02 ENCOUNTER — Encounter (HOSPITAL_BASED_OUTPATIENT_CLINIC_OR_DEPARTMENT_OTHER)
Admission: RE | Disposition: A | Discharge status: Home / Self Care | Payer: Self-pay | Source: Ambulatory Visit | Attending: Urology

## 2015-02-02 ENCOUNTER — Ambulatory Visit (HOSPITAL_BASED_OUTPATIENT_CLINIC_OR_DEPARTMENT_OTHER)
Admission: RE | Admit: 2015-02-02 | Discharge: 2015-02-02 | Disposition: A | Discharge status: Home / Self Care | Payer: No Typology Code available for payment source | Source: Ambulatory Visit | Attending: Urology | Admitting: Urology

## 2015-02-02 DIAGNOSIS — I714 Abdominal aortic aneurysm, without rupture: Secondary | ICD-10-CM | POA: Diagnosis not present

## 2015-02-02 DIAGNOSIS — N433 Hydrocele, unspecified: Secondary | ICD-10-CM | POA: Diagnosis present

## 2015-02-02 DIAGNOSIS — I10 Essential (primary) hypertension: Secondary | ICD-10-CM | POA: Diagnosis not present

## 2015-02-02 DIAGNOSIS — F17200 Nicotine dependence, unspecified, uncomplicated: Secondary | ICD-10-CM | POA: Diagnosis not present

## 2015-02-02 DIAGNOSIS — I739 Peripheral vascular disease, unspecified: Secondary | ICD-10-CM | POA: Insufficient documentation

## 2015-02-02 HISTORY — DX: Essential (primary) hypertension: I10

## 2015-02-02 HISTORY — DX: Abdominal aortic aneurysm, without rupture, unspecified: I71.40

## 2015-02-02 HISTORY — DX: Cardiac murmur, unspecified: R01.1

## 2015-02-02 HISTORY — DX: Unspecified right bundle-branch block: I45.10

## 2015-02-02 HISTORY — DX: Hydrocele, unspecified: N43.3

## 2015-02-02 HISTORY — DX: Hyperlipidemia, unspecified: E78.5

## 2015-02-02 HISTORY — PX: HYDROCELE EXCISION: SHX482

## 2015-02-02 HISTORY — DX: Abdominal aortic aneurysm, without rupture: I71.4

## 2015-02-02 LAB — BASIC METABOLIC PANEL
ANION GAP: 5 (ref 5–15)
BUN: 11 mg/dL (ref 6–20)
CALCIUM: 9.3 mg/dL (ref 8.9–10.3)
CO2: 24 mmol/L (ref 22–32)
Chloride: 109 mmol/L (ref 101–111)
Creatinine, Ser: 0.85 mg/dL (ref 0.61–1.24)
GFR calc Af Amer: >60 mL/min (ref >60)
Glucose, Bld: 102 mg/dL — ABNORMAL HIGH (ref 65–99)
POTASSIUM: 3.9 mmol/L (ref 3.5–5.1)
SODIUM: 138 mmol/L (ref 135–145)

## 2015-02-02 LAB — PROTIME-INR
INR: 0.96 (ref 0.00–1.49)
PROTHROMBIN TIME: 13 s (ref 11.6–15.2)

## 2015-02-02 LAB — CBC
HEMATOCRIT: 40.7 % (ref 39.0–52.0)
HEMOGLOBIN: 13.4 g/dL (ref 13.0–17.0)
MCH: 29.7 pg (ref 26.0–34.0)
MCHC: 32.9 g/dL (ref 30.0–36.0)
MCV: 90.2 fL (ref 78.0–100.0)
Platelets: 230 10*3/uL (ref 150–400)
RBC: 4.51 MIL/uL (ref 4.22–5.81)
RDW: 12.6 % (ref 11.5–15.5)
WBC: 6.7 10*3/uL (ref 4.0–10.5)

## 2015-02-02 LAB — APTT: APTT: 31 s (ref 24–37)

## 2015-02-02 SURGERY — HYDROCELECTOMY
Anesthesia: General | Laterality: Left

## 2015-02-02 MED ORDER — LACTATED RINGERS IV SOLN
INTRAVENOUS | Status: DC
  Filled 2015-02-02: qty 1000

## 2015-02-02 MED ORDER — CEPHALEXIN 500 MG PO CAPS: 500.0000 mg | ORAL_CAPSULE | Freq: Four times a day (QID) | ORAL | Status: DC

## 2015-02-02 MED ORDER — DEXAMETHASONE SODIUM PHOSPHATE 4 MG/ML IJ SOLN
INTRAMUSCULAR | Status: DC | PRN
  Administered 2015-02-02: 10 mg via INTRAVENOUS

## 2015-02-02 MED ORDER — GLYCOPYRROLATE 0.2 MG/ML IJ SOLN
INTRAMUSCULAR | Status: DC | PRN
  Administered 2015-02-02: 0.2 mg via INTRAVENOUS

## 2015-02-02 MED ORDER — CEFAZOLIN SODIUM-DEXTROSE 2-3 GM-% IV SOLR
INTRAVENOUS | Status: AC
  Filled 2015-02-02: qty 50

## 2015-02-02 MED ORDER — FENTANYL CITRATE (PF) 100 MCG/2ML IJ SOLN
INTRAMUSCULAR | Status: DC | PRN
  Administered 2015-02-02 (×3): 25 ug via INTRAVENOUS
  Administered 2015-02-02: 50 ug via INTRAVENOUS
  Administered 2015-02-02: 25 ug via INTRAVENOUS

## 2015-02-02 MED ORDER — FENTANYL CITRATE (PF) 100 MCG/2ML IJ SOLN
25.0000 ug | INTRAMUSCULAR | Status: DC | PRN
  Filled 2015-02-02: qty 1

## 2015-02-02 MED ORDER — FENTANYL CITRATE (PF) 100 MCG/2ML IJ SOLN
INTRAMUSCULAR | Status: AC
  Filled 2015-02-02: qty 4

## 2015-02-02 MED ORDER — HYDROCODONE-ACETAMINOPHEN 5-325 MG PO TABS: 1.0000 | ORAL_TABLET | ORAL | Status: DC | PRN

## 2015-02-02 MED ORDER — ONDANSETRON HCL 4 MG/2ML IJ SOLN
INTRAMUSCULAR | Status: DC | PRN
  Administered 2015-02-02: 4 mg via INTRAVENOUS

## 2015-02-02 MED ORDER — CEFAZOLIN SODIUM-DEXTROSE 2-3 GM-% IV SOLR
2.0000 g | INTRAVENOUS | Status: AC
  Administered 2015-02-02: 2 g via INTRAVENOUS
  Filled 2015-02-02: qty 50

## 2015-02-02 MED ORDER — TRIPLE ANTIBIOTIC 3.5-400-5000 EX OINT
TOPICAL_OINTMENT | CUTANEOUS | Status: DC | PRN
  Administered 2015-02-02: 1 via TOPICAL

## 2015-02-02 MED ORDER — MIDAZOLAM HCL 2 MG/2ML IJ SOLN
INTRAMUSCULAR | Status: AC
  Filled 2015-02-02: qty 2

## 2015-02-02 MED ORDER — ACETAMINOPHEN 10 MG/ML IV SOLN
INTRAVENOUS | Status: DC | PRN
  Administered 2015-02-02: 1000 mg via INTRAVENOUS

## 2015-02-02 MED ORDER — PROPOFOL 10 MG/ML IV BOLUS
INTRAVENOUS | Status: DC | PRN
  Administered 2015-02-02: 200 mg via INTRAVENOUS
  Administered 2015-02-02: 100 mg via INTRAVENOUS

## 2015-02-02 MED ORDER — LIDOCAINE HCL (CARDIAC) 20 MG/ML IV SOLN
INTRAVENOUS | Status: DC | PRN
  Administered 2015-02-02: 100 mg via INTRAVENOUS

## 2015-02-02 MED ORDER — BUPIVACAINE HCL (PF) 0.25 % IJ SOLN
INTRAMUSCULAR | Status: DC | PRN
  Administered 2015-02-02: 10 mL

## 2015-02-02 MED ORDER — LACTATED RINGERS IV SOLN
INTRAVENOUS | Status: DC
  Administered 2015-02-02 (×2): via INTRAVENOUS
  Filled 2015-02-02: qty 1000

## 2015-02-02 MED ORDER — EPHEDRINE SULFATE 50 MG/ML IJ SOLN
INTRAMUSCULAR | Status: DC | PRN
  Administered 2015-02-02 (×2): 15 mg via INTRAVENOUS

## 2015-02-02 MED ORDER — MIDAZOLAM HCL 5 MG/5ML IJ SOLN
INTRAMUSCULAR | Status: DC | PRN
  Administered 2015-02-02: 2 mg via INTRAVENOUS

## 2015-02-02 SURGICAL SUPPLY — 49 items
BLADE CLIPPER SURG (BLADE) ×3 IMPLANT
BLADE HEX COATED 2.75 (ELECTRODE) ×3 IMPLANT
BLADE SURG 15 STRL LF DISP TIS (BLADE) ×1 IMPLANT
BLADE SURG 15 STRL SS (BLADE) ×2
BNDG GAUZE ELAST 4 BULKY (GAUZE/BANDAGES/DRESSINGS) ×3 IMPLANT
COVER BACK TABLE 60X90IN (DRAPES) ×3 IMPLANT
COVER MAYO STAND STRL (DRAPES) ×3 IMPLANT
DISSECTOR ROUND CHERRY 3/8 STR (MISCELLANEOUS) IMPLANT
DRAIN PENROSE 18X1/2 LTX STRL (DRAIN) IMPLANT
DRAIN PENROSE 18X1/4 LTX STRL (WOUND CARE) ×3 IMPLANT
DRAPE PED LAPAROTOMY (DRAPES) ×3 IMPLANT
DRSG TEGADERM 4X4.75 (GAUZE/BANDAGES/DRESSINGS) IMPLANT
DRSG TELFA 3X8 NADH (GAUZE/BANDAGES/DRESSINGS) ×3 IMPLANT
ELECT REM PT RETURN 9FT ADLT (ELECTROSURGICAL) ×3
ELECTRODE REM PT RTRN 9FT ADLT (ELECTROSURGICAL) ×1 IMPLANT
GLOVE BIO SURGEON STRL SZ 6.5 (GLOVE) ×2 IMPLANT
GLOVE BIO SURGEON STRL SZ7 (GLOVE) ×3 IMPLANT
GLOVE BIO SURGEON STRL SZ7.5 (GLOVE) ×3 IMPLANT
GLOVE BIO SURGEONS STRL SZ 6.5 (GLOVE) ×1
GLOVE BIOGEL PI IND STRL 6.5 (GLOVE) ×1 IMPLANT
GLOVE BIOGEL PI INDICATOR 6.5 (GLOVE) ×2
GLOVE INDICATOR 7.5 STRL GRN (GLOVE) ×3 IMPLANT
GOWN STRL REUS W/ TWL LRG LVL3 (GOWN DISPOSABLE) ×2 IMPLANT
GOWN STRL REUS W/TWL LRG LVL3 (GOWN DISPOSABLE) ×4
KIT ROOM TURNOVER WOR (KITS) ×3 IMPLANT
LIQUID BAND (GAUZE/BANDAGES/DRESSINGS) IMPLANT
MANIFOLD NEPTUNE II (INSTRUMENTS) ×3 IMPLANT
NEEDLE HYPO 25X1 1.5 SAFETY (NEEDLE) ×3 IMPLANT
NS IRRIG 500ML POUR BTL (IV SOLUTION) ×3 IMPLANT
PACK BASIN DAY SURGERY FS (CUSTOM PROCEDURE TRAY) ×3 IMPLANT
PENCIL BUTTON HOLSTER BLD 10FT (ELECTRODE) ×3 IMPLANT
SUPPORT SCROTAL LG STRP (MISCELLANEOUS) ×2 IMPLANT
SUPPORTER ATHLETIC LG (MISCELLANEOUS) ×1
SUT CHROMIC 3 0 SH 27 (SUTURE) ×12 IMPLANT
SUT CHROMIC 4 0 RB 1X27 (SUTURE) ×3 IMPLANT
SUT ETHILON 3 0 PS 1 (SUTURE) IMPLANT
SUT MNCRL AB 4-0 PS2 18 (SUTURE) IMPLANT
SUT SILK 2 0 FS (SUTURE) ×3 IMPLANT
SUT VIC AB 2-0 SH 27 (SUTURE)
SUT VIC AB 2-0 SH 27XBRD (SUTURE) IMPLANT
SUT VIC AB 4-0 SH 27 (SUTURE)
SUT VIC AB 4-0 SH 27XANBCTRL (SUTURE) IMPLANT
SYR CONTROL 10ML LL (SYRINGE) ×3 IMPLANT
TOWEL OR 17X24 6PK STRL BLUE (TOWEL DISPOSABLE) ×6 IMPLANT
TRAY DSU PREP LF (CUSTOM PROCEDURE TRAY) ×3 IMPLANT
TUBE CONNECTING 12'X1/4 (SUCTIONS) ×1
TUBE CONNECTING 12X1/4 (SUCTIONS) ×2 IMPLANT
WATER STERILE IRR 500ML POUR (IV SOLUTION) IMPLANT
YANKAUER SUCT BULB TIP NO VENT (SUCTIONS) ×3 IMPLANT

## 2015-02-02 NOTE — Anesthesia Procedure Notes (Signed)
Procedure Name: LMA Insertion Date/Time: 02/02/2015 11:13 AM Performed by: Norva PavlovALLAWAY, Susan Arana G Pre-anesthesia Checklist: Patient identified, Emergency Drugs available, Suction available and Patient being monitored Patient Re-evaluated:Patient Re-evaluated prior to inductionOxygen Delivery Method: Circle System Utilized Preoxygenation: Pre-oxygenation with 100% oxygen Intubation Type: IV induction Ventilation: Mask ventilation without difficulty LMA: LMA inserted LMA Size: 5.0 Number of attempts: 1 Airway Equipment and Method: bite block Placement Confirmation: positive ETCO2 Tube secured with: Tape Dental Injury: Teeth and Oropharynx as per pre-operative assessment

## 2015-02-02 NOTE — Op Note (Signed)
Preoperative diagnosis: Left hydrocele  Postoperative diagnosis: Same  Procedure: Left hydrocelectomy  Surgeon: Dr. Baruch Gouty  Specimen: Left hydrocele sac  Drains: Half-inch Penrose strain in the inferior aspect of the scrotum  Anesthesia: Gen.  Disposition: Stable to the postanesthesia care unit  Indications for procedure: The patient is a 49 year old gentleman with a large left hydrocelectomy through symptomatic. It is so large it is difficult for him to urinate without getting urinate on himself.  Description of procedure: The patient was met in the preoperative area. All risks, benefits, indications procedure described in great detail. The patient consented to the procedure. Preoperative antibiotics were given. The patient was taken back to the operative theater. Gen. anesthesia was induced per the anesthesia service. The patient was then shaved and prepped and draped usual sterile fashion. Timeout was called. A approximately 8 mm incision was made along the median raphae. Electrocautery was then used to to cut through dartos tissue. The left hydrocele sac was then encountered. All surrounding tissues were male removed from it. The hydrocele sac was then delivered from the scrotum. All fibrous connections were manually removed. Hemostasis was obtained as needed with electrocautery. Hydrocele sac was then opened and a 1 L fluid drained. The excess sac was then removed with electrocautery. Great care was taken not to injure the testicle, spermatic cord, vas deferens, or stranding structures. Once the excess sac was removed and the edges were fulgurated. Then using a 3-0 chromic suture a jobalea type closure was used to reapproximate the ends of the remaining hydrocele sac posterior to the testicle. Great care is taken not to provide too much tension on the surrounding vas and spermatic cord. A finger could easily be placed behind this reapproximated fashion the testicle. At this point  hemostasis was excellent. All scrotal and are close tissue oozing was contained with  electrocautery. Hemostasis again was excellent at this point. A half-inch Penrose drain was placed in the inferior portion of the left hemiscrotum. It was fixed with a drain stitch. And placed internally within the scrotum. The dartos tissues and reapproximated a running 3-0 chromic. The skin was reapproximated with interrupted 3-0 chromic suture. 25 was placed in the incision and on the exit of the Penrose site. Kerlix is placed over this. Then a scrotal support was placed. Patient was then transferred stable condition to the postanesthesia care unit.  Plan: The patient will be started to wear scrotal support for up to one week. He will follow-up in a few days to have his drain removed. He'll follow-up with me in approximate 1 month to half a incision check after surgery.

## 2015-02-02 NOTE — H&P (Signed)
Chief Complaint Left hydrocele   History of Present Illness 49 y.o. male presenting for follow up for left hydrocele.   Surgical History Problems  1. History of No Surgical Problems  Current Meds 1. No Medications;  Therapy: (Recorded:26May2016) to Recorded  Allergies Medication  1. No Known Drug Allergies  Family History Problems  1. Family history of hypertension (Z82.49) : Mother, Brother  Social History Problems  1. Alcohol use (Z78.9) 2. Caffeine use (F15.90) 3. Current smoker (F17.200) 4. Father deceased   3063 heart disease 5. Mother alive and healthy 6. Occupation   handyman 7. Single  Vitals Vital Signs [Data Includes: Last 1 Day]  Recorded: 19Sep2016 12:04PM  Blood Pressure: 206 / 123 Temperature: 98 F Heart Rate: 52  Physical Exam Constitutional: Well nourished.   ENT:. The ears and nose are normal in appearance.   Neck: The appearance of the neck is normal.   Pulmonary: No respiratory distress.   Cardiovascular:. No peripheral edema.   Abdomen: The abdomen is not distended. The abdomen is soft and nontender.   Rectal: The prostate exam was deferred.   Genitourinary: Examination of the left scrotum demostrates a hydrocele. The right testis is palpably normal. Approximately 10 cm left hydrocele.   Skin: Normal skin turgor and no visible rash.   Neuro/Psych:. Mood and affect are appropriate.    Results/Data Urine [Data Includes: Last 1 Day]   19Sep2016  COLOR YELLOW   APPEARANCE CLEAR   SPECIFIC GRAVITY 1.025   pH 6.0   GLUCOSE NEGATIVE   BILIRUBIN NEGATIVE   KETONE NEGATIVE   BLOOD NEGATIVE   PROTEIN NEGATIVE   NITRITE NEGATIVE   LEUKOCYTE ESTERASE NEGATIVE    Assessment 1. Left hydrocele    I discussed the risks, benefits, and alternatives to hydrocelectomy. The patient is aware that risks include hematoma and infection. He is aware that the hydrocele could return. A   Plan   1. Left hydrocele -left hydrocelectomy

## 2015-02-02 NOTE — Anesthesia Preprocedure Evaluation (Addendum)
Anesthesia Evaluation  Patient identified by MRN, date of birth, ID band Patient awake    Reviewed: Allergy & Precautions, H&P , NPO status , Patient's Chart, lab work & pertinent test results  Airway Mallampati: II  TM Distance: >3 FB Neck ROM: full    Dental  (+) Dental Advisory Given, Chipped, Missing Left upper front very large chip/ break.  Missing left upper lateral:   Pulmonary Current Smoker,    Pulmonary exam normal breath sounds clear to auscultation       Cardiovascular Exercise Tolerance: Good hypertension, Pt. on medications + Peripheral Vascular Disease  Normal cardiovascular exam Rhythm:regular Rate:Normal  AAA being watched. Bifascicular block on ECG   Neuro/Psych negative neurological ROS  negative psych ROS   GI/Hepatic negative GI ROS, Neg liver ROS,   Endo/Other  negative endocrine ROS  Renal/GU negative Renal ROS  negative genitourinary   Musculoskeletal   Abdominal   Peds  Hematology negative hematology ROS (+)   Anesthesia Other Findings   Reproductive/Obstetrics negative OB ROS                            Anesthesia Physical Anesthesia Plan  ASA: III  Anesthesia Plan: General   Post-op Pain Management:    Induction: Intravenous  Airway Management Planned: LMA  Additional Equipment:   Intra-op Plan:   Post-operative Plan: Extubation in OR  Informed Consent: I have reviewed the patients History and Physical, chart, labs and discussed the procedure including the risks, benefits and alternatives for the proposed anesthesia with the patient or authorized representative who has indicated his/her understanding and acceptance.   Dental Advisory Given  Plan Discussed with: CRNA and Surgeon  Anesthesia Plan Comments:         Anesthesia Quick Evaluation

## 2015-02-02 NOTE — Transfer of Care (Signed)
Last Vitals:  Filed Vitals:   02/02/15 0910  BP: 158/95  Pulse: 65  Temp: 36.8 C  Resp: 16   Immediate Anesthesia Transfer of Care Note  Patient: Randall Ferrell  Procedure(s) Performed: Procedure(s) (LRB): HYDROCELECTOMY ADULT (Left)  Patient Location: PACU  Anesthesia Type: General  Level of Consciousness: awake, alert  and oriented  Airway & Oxygen Therapy: Patient Spontanous Breathing and Patient connected to face mask oxygen  Post-op Assessment: Report given to PACU RN and Post -op Vital signs reviewed and stable  Post vital signs: Reviewed and stable  Complications: No apparent anesthesia complications

## 2015-02-02 NOTE — Discharge Instructions (Signed)
HOME CARE INSTRUCTIONS FOR SCROTAL PROCEDURES ° °Wound Care & Hygiene: °You may apply an ice bag to the scrotum for the first 24 hours.  This may help decrease swelling and soreness.  You may have a dressing held in place by an athletic supporter.  You may remove the dressing in 24 hours and shower in 48 hours.  Continue to use the athletic supporter or tight briefs for at least a week. °Activity: °Rest today - not necessarily flat bed rest.  Just take it easy.  You should not do strenuous activities until your follow-up visit with your doctor.  You may resume light activity in 48 hours. ° °Return to Work: ° °Your doctor will advise you of this depending on the type of work you do ° °Diet: °Drink liquids or eat a light diet this evening.  You may resume a regular diet tomorrow. ° °General Expectations: °You may have a small amount of bleeding.  The scrotum may be swollen or bruised for about a week. ° °Call your Doctor if these occur: ° -persistent or heavy bleeding ° -temperature of 101 degrees or more ° -severe pain, not relieved by your pain medication ° °Return to Doctor's Office:  °Call to set up and appointment. ° °Patient Signature:  __________________________________________________ ° °Nurse's Signature:  __________________________________________________ ° °Post Anesthesia Home Care Instructions ° °Activity: °Get plenty of rest for the remainder of the day. A responsible adult should stay with you for 24 hours following the procedure.  °For the next 24 hours, DO NOT: °-Drive a car °-Operate machinery °-Drink alcoholic beverages °-Take any medication unless instructed by your physician °-Make any legal decisions or sign important papers. ° °Meals: °Start with liquid foods such as gelatin or soup. Progress to regular foods as tolerated. Avoid greasy, spicy, heavy foods. If nausea and/or vomiting occur, drink only clear liquids until the nausea and/or vomiting subsides. Call your physician if vomiting  continues. ° °Special Instructions/Symptoms: °Your throat may feel dry or sore from the anesthesia or the breathing tube placed in your throat during surgery. If this causes discomfort, gargle with warm salt water. The discomfort should disappear within 24 hours. ° °If you had a scopolamine patch placed behind your ear for the management of post- operative nausea and/or vomiting: ° °1. The medication in the patch is effective for 72 hours, after which it should be removed.  Wrap patch in a tissue and discard in the trash. Wash hands thoroughly with soap and water. °2. You may remove the patch earlier than 72 hours if you experience unpleasant side effects which may include dry mouth, dizziness or visual disturbances. °3. Avoid touching the patch. Wash your hands with soap and water after contact with the patch. °  ° °

## 2015-02-03 ENCOUNTER — Encounter (HOSPITAL_BASED_OUTPATIENT_CLINIC_OR_DEPARTMENT_OTHER): Payer: Self-pay | Admitting: Urology

## 2015-02-03 NOTE — Anesthesia Postprocedure Evaluation (Signed)
  Anesthesia Post-op Note  Patient: Randall Ferrell  Procedure(s) Performed: Procedure(s) (LRB): HYDROCELECTOMY ADULT (Left)  Patient Location: PACU  Anesthesia Type: General  Level of Consciousness: awake and alert   Airway and Oxygen Therapy: Patient Spontanous Breathing  Post-op Pain: mild  Post-op Assessment: Post-op Vital signs reviewed, Patient's Cardiovascular Status Stable, Respiratory Function Stable, Patent Airway and No signs of Nausea or vomiting  Last Vitals:  Filed Vitals:   02/02/15 1354  BP: 147/92  Pulse: 69  Temp: 37.2 C  Resp: 20    Post-op Vital Signs: stable   Complications: No apparent anesthesia complications

## 2015-02-27 DIAGNOSIS — I10 Essential (primary) hypertension: Secondary | ICD-10-CM | POA: Insufficient documentation

## 2017-04-02 ENCOUNTER — Encounter (HOSPITAL_BASED_OUTPATIENT_CLINIC_OR_DEPARTMENT_OTHER): Payer: Self-pay | Admitting: Adult Health

## 2017-04-02 ENCOUNTER — Other Ambulatory Visit: Payer: Self-pay

## 2017-04-02 ENCOUNTER — Telehealth (HOSPITAL_BASED_OUTPATIENT_CLINIC_OR_DEPARTMENT_OTHER): Payer: Self-pay | Admitting: Emergency Medicine

## 2017-04-02 ENCOUNTER — Emergency Department (HOSPITAL_BASED_OUTPATIENT_CLINIC_OR_DEPARTMENT_OTHER)
Admission: EM | Admit: 2017-04-02 | Discharge: 2017-04-02 | Disposition: A | Discharge status: Home / Self Care | Payer: No Typology Code available for payment source | Attending: Emergency Medicine | Admitting: Emergency Medicine

## 2017-04-02 DIAGNOSIS — I160 Hypertensive urgency: Secondary | ICD-10-CM | POA: Insufficient documentation

## 2017-04-02 DIAGNOSIS — Z79899 Other long term (current) drug therapy: Secondary | ICD-10-CM | POA: Insufficient documentation

## 2017-04-02 DIAGNOSIS — F17210 Nicotine dependence, cigarettes, uncomplicated: Secondary | ICD-10-CM | POA: Insufficient documentation

## 2017-04-02 LAB — BASIC METABOLIC PANEL
ANION GAP: 8 (ref 5–15)
BUN: 10 mg/dL (ref 6–20)
CALCIUM: 8.9 mg/dL (ref 8.9–10.3)
CO2: 23 mmol/L (ref 22–32)
Chloride: 105 mmol/L (ref 101–111)
Creatinine, Ser: 0.95 mg/dL (ref 0.61–1.24)
GFR calc Af Amer: >60 mL/min (ref >60)
GLUCOSE: 123 mg/dL — AB (ref 65–99)
Potassium: 3.5 mmol/L (ref 3.5–5.1)
SODIUM: 136 mmol/L (ref 135–145)

## 2017-04-02 MED ORDER — LOSARTAN POTASSIUM 100 MG PO TABS: 100.0000 mg | ORAL_TABLET | Freq: Two times a day (BID) | ORAL | 0 refills | Status: DC

## 2017-04-02 NOTE — Discharge Instructions (Signed)
Please take the blood pressure medicine as prescribed.  You must see a primary care doctor for optimal management of your blood pressure.  Losartan is all under the [facility] at [facility].

## 2017-04-02 NOTE — ED Provider Notes (Signed)
[facility] EMERGENCY DEPARTMENT Provider Note   CSN: [id number] Arrival date & time: 04/02/17  1308     History   Chief Complaint Chief Complaint  Patient presents with  . Hypertension    HPI Randall Ferrell is a 52 y.o. male.  HPI  52 year old male with history of poorly controlled hypertension, AAA comes in with chief complaint of elevated blood pressure.  Patient reports that he has not taken his blood pressure medicine in several months.  Patient has been having intermittent mild headaches, and his friend who is a CNA got concerned and decided to bring him into the ER.  Patient denies any current chest pain, headache, vision changes, focal numbness or tingling.  Past Medical History:  Diagnosis Date  . AAA (abdominal aortic aneurysm) (HCC) per duplex at dr Jacinto Halimganji office 10-29-2014   moderate distal aorta  w/ bilateral iliac artery involvement 2.45cm X 3.43cm/  involves bilateral iliac vessels 12mm  . Heart murmur   . Hyperlipidemia   . Hypertension   . Incomplete right bundle branch block   . Left hydrocele     Patient Active Problem List   Diagnosis Date Noted  . Essential hypertension, benign 02/27/2015    Past Surgical History:  Procedure Laterality Date  . CARDIOVASCULAR STRESS TEST  09-22-2014  dr Jacinto Halimganji   normal perfusion study/  normal LV function and wall motion , ef 62%  . HYDROCELE EXCISION Left 02/02/2015   Procedure: HYDROCELECTOMY ADULT;  Surgeon: Hildred LaserBrian James Budzyn, MD;  [facility];  Service: Urology;  Laterality: Left;  . TRANSTHORACIC ECHOCARDIOGRAM  10-29-2014   dr Jacinto Halimganji   moderate concentric LVH, ef 60%/  mild AR, MR, and TR/  IVC borderline dilated w/ respiratory variation       Home Medications    Prior to Admission medications   Medication Sig Start Date End Date Taking? Authorizing Provider  amLODipine (NORVASC) 5 MG tablet Take 10 mg by mouth every morning.    [provider]  cephALEXin  (KEFLEX) 500 MG capsule Take 1 capsule (500 mg total) by mouth 4 (four) times daily. 02/02/15   Hildred LaserBudzyn, Brian James, MD  HYDROcodone-acetaminophen (NORCO) 5-325 MG tablet Take 1-2 tablets by mouth every 4 (four) hours as needed for moderate pain. 02/02/15   Hildred LaserBudzyn, Brian James, MD  losartan (COZAAR) 100 MG tablet Take 1 tablet (100 mg total) by mouth 2 (two) times daily. 04/02/17   Derwood KaplanNanavati, Izzy Doubek, MD    Family History Family History  Problem Relation Age of Onset  . Heart disease Father   . Heart disease Brother     Social History Social History   Tobacco Use  . Smoking status: Current Every Day Smoker    Packs/day: 0.50    Years: 38.00    Pack years: 19.00    Types: Cigarettes  . Smokeless tobacco: Never Used  Substance Use Topics  . Alcohol use: Yes    Alcohol/week: 0.0 oz    Comment: 120 oz beer per day (3- 40 oz can)  . Drug use: No     Allergies   Patient has no known allergies.   Review of Systems Review of Systems  Constitutional: Negative for activity change.  Respiratory: Negative for shortness of breath.   Cardiovascular: Negative for chest pain.     Physical Exam Updated Vital Signs BP (!) 184/107 (BP Location: Left Arm)   Pulse 63   Temp 98.4 F (36.9 C) (Oral)   Resp 20  SpO2 98%   Physical Exam  Constitutional: He is oriented to person, place, and time. He appears well-developed.  HENT:  Head: Atraumatic.  Neck: Neck supple.  Cardiovascular: Normal rate.  Pulmonary/Chest: Effort normal.  Neurological: He is alert and oriented to person, place, and time.  Skin: Skin is warm.  Nursing note and vitals reviewed.    ED Treatments / Results  Labs (all labs ordered are listed, but only abnormal results are displayed) Labs Reviewed  BASIC METABOLIC PANEL    EKG  EKG Interpretation None       Radiology No results found.  Procedures Procedures (including critical care time)  Medications Ordered in ED Medications - No data to  display   Initial Impression / Assessment and Plan / ED Course  I have reviewed the triage vital signs and the nursing notes.  Pertinent labs & imaging results that were available during my care of the patient were reviewed by me and considered in my medical decision making (see chart for details).     Patient comes in with chief complaint of elevated blood pressure.  Currently patient is not symptomatic, specifically he has no headache, chest pain, shortness of breath, vision change, focal neurologic symptoms.  Patient is noted to have blood pressure in the 180s systolic.  Patient informs me that he used to be on losartan twice daily which worked better.  Patient has not seen a PCP or been on any antihypertensive medication in a long time.  We will check BMP, if looks normal then patient would be started on losartan.  Otherwise patient will be started on amlodipine and discharged with outpatient follow-up.  Final Clinical Impressions(s) / ED Diagnoses   Final diagnoses:  Hypertensive urgency    ED Discharge Orders        Ordered    losartan (COZAAR) 100 MG tablet  2 times daily     04/02/17 1513       Derwood Kaplan, MD 04/02/17 1545

## 2017-04-02 NOTE — ED Triage Notes (Signed)
Presents with high BP 205./100 this AM, he has not been taking his BP medications because he does not have a MD that he sees here. He recently moved and does not have a cardiologist here. He denies chest pain, pressure, blurred vision, tingling, nausea, dizziness. He endorses headache.

## 2017-04-12 NOTE — Progress Notes (Signed)
Patient ID: Randall Ferrell, male   DOB: 28-Feb-1966, 52 y.o.   MRN: [id number]      Randall Ferrell, is a 52 y.o. male  [id number]  [id number]  DOB - 28-Feb-1966  Subjective:  Chief Complaint and HPI: Randall Ferrell is a 52 y.o. male here today to establish care and for a follow up visit After being seen in the ED 04/02/2017 for hypertensive urgency.  He had been out of his BP meds for about 1 month.  He previously took amlodipine.  He was started on Losartan 100mg  daily in the ED.  Amlodipine was not restarted.  Rarely drinks alcohol.  Smokes about 8 cigarettes/day.  No CP/dizziness/HA.  He did see a cardiologist many years ago.    ED/Hospital notes reviewed.   Family history: brother died MI early 4350s Social:  married  ROS:   Constitutional:  No f/c, No night sweats, No unexplained weight loss. EENT:  No vision changes, No blurry vision, No hearing changes. No mouth, throat, or ear problems.  Respiratory: No cough, No SOB Cardiac: No CP, no palpitations GI:  No abd pain, No N/V/D. GU: No Urinary s/sx Musculoskeletal: No joint pain Neuro: No headache, no dizziness, no motor weakness.  Skin: No rash Endocrine:  No polydipsia. No polyuria.  Psych: Denies SI/HI  No problems updated.  ALLERGIES: No Known Allergies  PAST MEDICAL HISTORY: Past Medical History:  Diagnosis Date  . AAA (abdominal aortic aneurysm) (HCC) per duplex at dr Jacinto Halimganji office 10-29-2014   moderate distal aorta  w/ bilateral iliac artery involvement 2.45cm X 3.43cm/  involves bilateral iliac vessels 12mm  . Heart murmur   . Hyperlipidemia   . Hypertension   . Incomplete right bundle branch block   . Left hydrocele     MEDICATIONS AT HOME: Prior to Admission medications   Medication Sig Start Date End Date Taking? Authorizing Provider  losartan (COZAAR) 100 MG tablet Take 1 tablet (100 mg total) by mouth daily. 04/13/17  Yes Georgian CoMcClung, Bernie Ransford M, PA-C  amLODipine (NORVASC) 10 MG tablet Take 1 tablet (10  mg total) by mouth daily. 04/13/17   Anders SimmondsMcClung, Marcellas Marchant M, PA-C     Objective:  EXAM:   Vitals:   04/13/17 1344 04/13/17 1347  BP: (!) 177/103 (!) 178/97  Pulse: (!) 57   Resp: 16   Temp: 98.1 F (36.7 C)   TempSrc: Oral   SpO2: 99%   Weight: 230 lb 9.6 oz (104.6 kg)   Height: 5\' 11"  (1.803 m)     General appearance : A&OX3. NAD. Non-toxic-appearing HEENT: Atraumatic and Normocephalic.  PERRLA. EOM intact.  Neck: supple, no JVD. No cervical lymphadenopathy. No thyromegaly Chest/Lungs:  Breathing-non-labored, Good air entry bilaterally, breath sounds normal without rales, rhonchi, or wheezing  CVS: S1 S2 regular, no murmurs, gallops, rubs  Extremities: Bilateral Lower Ext shows no edema, both legs are warm to touch with = pulse throughout Neurology:  CN II-XII grossly intact, Non focal.   Psych:  TP linear. J/I WNL. Normal speech. Appropriate eye contact and affect.  Skin:  No Rash  Data Review Lab Results  Component Value Date   HGBA1C 5.4 09/14/2014     Assessment & Plan   1. Essential hypertension, benign uncontrolled continue- losartan (COZAAR) 100 MG tablet; Take 1 tablet (100 mg total) by mouth daily.  Dispense: 30 tablet; Refill: 5 Add/resume- amLODipine (NORVASC) 10 MG tablet; Take 1 tablet (10 mg total) by mouth daily.  Dispense: 90 tablet; Refill: 3 Check BP  4 times/week and record and bring to next visit  2. Smoking Smoking and dangers of nicotine have been discussed at length. Long term health consequences of smoking reviewed in detail.  Methods for helping with cessation have been reviewed.  Patient expresses understanding.   3. Family history of heart disease Consider cardiology referal  4. Hyperglycemia I have had a lengthy discussion and provided education about insulin resistance and the intake of too much sugar/refined carbohydrates.  I have advised the patient to work at a goal of eliminating sugary drinks, candy, desserts, sweets, refined sugars,  processed foods, and white carbohydrates.  The patient expresses understanding.   5.  Hospital follow-up   Patient have been counseled extensively about nutrition and exercise  Return in about 5 weeks (around 05/18/2017) for assign PCP; htn .  The patient was given clear instructions to go to ER or return to medical center if symptoms don't improve, worsen or new problems develop. The patient verbalized understanding. The patient was told to call to get lab results if they haven't heard anything in the next week.     Georgian Co, PA-C [facility] [place], [place] [phone]   04/13/2017, 2:07 PM

## 2017-04-13 ENCOUNTER — Ambulatory Visit: Payer: No Typology Code available for payment source | Attending: Internal Medicine | Admitting: Physician Assistant

## 2017-04-13 VITALS — BP 178/97 | HR 57 | Temp 98.1°F | Resp 16 | Ht 71.0 in | Wt 230.6 lb

## 2017-04-13 DIAGNOSIS — I10 Essential (primary) hypertension: Secondary | ICD-10-CM | POA: Insufficient documentation

## 2017-04-13 DIAGNOSIS — Z8249 Family history of ischemic heart disease and other diseases of the circulatory system: Secondary | ICD-10-CM | POA: Insufficient documentation

## 2017-04-13 DIAGNOSIS — Z09 Encounter for follow-up examination after completed treatment for conditions other than malignant neoplasm: Secondary | ICD-10-CM | POA: Diagnosis not present

## 2017-04-13 DIAGNOSIS — F17200 Nicotine dependence, unspecified, uncomplicated: Secondary | ICD-10-CM | POA: Diagnosis not present

## 2017-04-13 DIAGNOSIS — R739 Hyperglycemia, unspecified: Secondary | ICD-10-CM | POA: Insufficient documentation

## 2017-04-13 DIAGNOSIS — F17210 Nicotine dependence, cigarettes, uncomplicated: Secondary | ICD-10-CM | POA: Insufficient documentation

## 2017-04-13 MED ORDER — AMLODIPINE BESYLATE 10 MG PO TABS: 10.0000 mg | ORAL_TABLET | Freq: Every day | ORAL | 3 refills | Status: DC

## 2017-04-13 MED ORDER — LOSARTAN POTASSIUM 100 MG PO TABS: 100.0000 mg | ORAL_TABLET | Freq: Every day | ORAL | 5 refills | Status: DC

## 2017-04-13 MED FILL — AMLODIPINE BESYLATE 10 MG T: 10 | 30 days supply | Qty: 30 | Fill #0

## 2017-04-13 MED FILL — LOSARTAN POTASSIUM 100 MG T: 100 | 30 days supply | Qty: 30 | Fill #0

## 2017-04-13 NOTE — Patient Instructions (Addendum)
Check blood pressure about 4 times/week and record and bring to your next visit    Hypertension Hypertension is another name for high blood pressure. High blood pressure forces your heart to work harder to pump blood. This can cause problems over time. There are two numbers in a blood pressure reading. There is a top number (systolic) over a bottom number (diastolic). It is best to have a blood pressure below 120/80. Healthy choices can help lower your blood pressure. You may need medicine to help lower your blood pressure if:  Your blood pressure cannot be lowered with healthy choices.  Your blood pressure is higher than 130/80.  Follow these instructions at home: Eating and drinking  If directed, follow the DASH eating plan. This diet includes: ? Filling half of your plate at each meal with fruits and vegetables. ? Filling one quarter of your plate at each meal with whole grains. Whole grains include whole wheat pasta, brown rice, and whole grain bread. ? Eating or drinking low-fat dairy products, such as skim milk or low-fat yogurt. ? Filling one quarter of your plate at each meal with low-fat (lean) proteins. Low-fat proteins include fish, skinless chicken, eggs, beans, and tofu. ? Avoiding fatty meat, cured and processed meat, or chicken with skin. ? Avoiding premade or processed food.  Eat less than 1,500 mg of salt (sodium) a day.  Limit alcohol use to no more than 1 drink a day for nonpregnant women and 2 drinks a day for men. One drink equals 12 oz of beer, 5 oz of wine, or 1 oz of hard liquor. Lifestyle  Work with your doctor to stay at a healthy weight or to lose weight. Ask your doctor what the best weight is for you.  Get at least 30 minutes of exercise that causes your heart to beat faster (aerobic exercise) most days of the week. This may include walking, swimming, or biking.  Get at least 30 minutes of exercise that strengthens your muscles (resistance exercise) at  least 3 days a week. This may include lifting weights or pilates.  Do not use any products that contain nicotine or tobacco. This includes cigarettes and e-cigarettes. If you need help quitting, ask your doctor.  Check your blood pressure at home as told by your doctor.  Keep all follow-up visits as told by your doctor. This is important. Medicines  Take over-the-counter and prescription medicines only as told by your doctor. Follow directions carefully.  Do not skip doses of blood pressure medicine. The medicine does not work as well if you skip doses. Skipping doses also puts you at risk for problems.  Ask your doctor about side effects or reactions to medicines that you should watch for. Contact a doctor if:  You think you are having a reaction to the medicine you are taking.  You have headaches that keep coming back (recurring).  You feel dizzy.  You have swelling in your ankles.  You have trouble with your vision. Get help right away if:  You get a very bad headache.  You start to feel confused.  You feel weak or numb.  You feel faint.  You get very bad pain in your: ? Chest. ? Belly (abdomen).  You throw up (vomit) more than once.  You have trouble breathing. Summary  Hypertension is another name for high blood pressure.  Making healthy choices can help lower blood pressure. If your blood pressure cannot be controlled with healthy choices, you may need to  take medicine. This information is not intended to replace advice given to you by your health care provider. Make sure you discuss any questions you have with your health care provider. Document Released: 08/31/2007 Document Revised: 02/10/2016 Document Reviewed: 02/10/2016 Elsevier Interactive Patient Education  2018 [employer]. Steps to Quit Smoking Smoking tobacco can be harmful to your health and can affect almost every organ in your body. Smoking puts you, and those around you, at risk for developing  many serious chronic diseases. Quitting smoking is difficult, but it is one of the best things that you can do for your health. It is never too late to quit. What are the benefits of quitting smoking? When you quit smoking, you lower your risk of developing serious diseases and conditions, such as:  Lung cancer or lung disease, such as COPD.  Heart disease.  Stroke.  Heart attack.  Infertility.  Osteoporosis and bone fractures.  Additionally, symptoms such as coughing, wheezing, and shortness of breath may get better when you quit. You may also find that you get sick less often because your body is stronger at fighting off colds and infections. If you are pregnant, quitting smoking can help to reduce your chances of having a baby of low birth weight. How do I get ready to quit? When you decide to quit smoking, create a plan to make sure that you are successful. Before you quit:  Pick a date to quit. Set a date within the next two weeks to give you time to prepare.  Write down the reasons why you are quitting. Keep this list in places where you will see it often, such as on your bathroom mirror or in your car or wallet.  Identify the people, places, things, and activities that make you want to smoke (triggers) and avoid them. Make sure to take these actions: ? Throw away all cigarettes at home, at work, and in your car. ? Throw away smoking accessories, such as Set designer. ? Clean your car and make sure to empty the ashtray. ? Clean your home, including curtains and carpets.  Tell your family, friends, and coworkers that you are quitting. Support from your loved ones can make quitting easier.  Talk with your health care provider about your options for quitting smoking.  Find out what treatment options are covered by your health insurance.  What strategies can I use to quit smoking? Talk with your healthcare provider about different strategies to quit smoking. Some  strategies include:  Quitting smoking altogether instead of gradually lessening how much you smoke over a period of time. Research shows that quitting "cold [place]" is more successful than gradually quitting.  Attending in-person counseling to help you build problem-solving skills. You are more likely to have success in quitting if you attend several counseling sessions. Even short sessions of 10 minutes can be effective.  Finding resources and support systems that can help you to quit smoking and remain smoke-free after you quit. These resources are most helpful when you use them often. They can include: ? Online chats with a Veterinary surgeon. ? Telephone quitlines. ? Automotive engineer. ? Support groups or group counseling. ? Text messaging programs. ? Mobile phone applications.  Taking medicines to help you quit smoking. (If you are pregnant or breastfeeding, talk with your health care provider first.) Some medicines contain nicotine and some do not. Both types of medicines help with cravings, but the medicines that include nicotine help to relieve withdrawal symptoms. Your health  care provider may recommend: ? Nicotine patches, gum, or lozenges. ? Nicotine inhalers or sprays. ? Non-nicotine medicine that is taken by mouth.  Talk with your health care provider about combining strategies, such as taking medicines while you are also receiving in-person counseling. Using these two strategies together makes you more likely to succeed in quitting than if you used either strategy on its own. If you are pregnant or breastfeeding, talk with your health care provider about finding counseling or other support strategies to quit smoking. Do not take medicine to help you quit smoking unless told to do so by your health care provider. What things can I do to make it easier to quit? Quitting smoking might feel overwhelming at first, but there is a lot that you can do to make it easier. Take these  important actions:  Reach out to your family and friends and ask that they support and encourage you during this time. Call telephone quitlines, reach out to support groups, or work with a counselor for support.  Ask people who smoke to avoid smoking around you.  Avoid places that trigger you to smoke, such as bars, parties, or smoke-break areas at work.  Spend time around people who do not smoke.  Lessen stress in your life, because stress can be a smoking trigger for some people. To lessen stress, try: ? Exercising regularly. ? Deep-breathing exercises. ? Yoga. ? Meditating. ? Performing a body scan. This involves closing your eyes, scanning your body from head to toe, and noticing which parts of your body are particularly tense. Purposefully relax the muscles in those areas.  Download or purchase mobile phone or tablet apps (applications) that can help you stick to your quit plan by providing reminders, tips, and encouragement. There are many free apps, such as QuitGuide from the [employer] developer(Centers for Disease Control and Prevention). You can find other support for quitting smoking (smoking cessation) through smokefree.gov and other websites.  How will I feel when I quit smoking? Within the first 24 hours of quitting smoking, you may start to feel some withdrawal symptoms. These symptoms are usually most noticeable 2-3 days after quitting, but they usually do not last beyond 2-3 weeks. Changes or symptoms that you might experience include:  Mood swings.  Restlessness, anxiety, or irritation.  Difficulty concentrating.  Dizziness.  Strong cravings for sugary foods in addition to nicotine.  Mild weight gain.  Constipation.  Nausea.  Coughing or a sore throat.  Changes in how your medicines work in your body.  A depressed mood.  Difficulty sleeping (insomnia).  After the first 2-3 weeks of quitting, you may start to notice more positive results, such as:  Improved sense of  smell and taste.  Decreased coughing and sore throat.  Slower heart rate.  Lower blood pressure.  Clearer skin.  The ability to breathe more easily.  Fewer sick days.  Quitting smoking is very challenging for most people. Do not get discouraged if you are not successful the first time. Some people need to make many attempts to quit before they achieve long-term success. Do your best to stick to your quit plan, and talk with your health care provider if you have any questions or concerns. This information is not intended to replace advice given to you by your health care provider. Make sure you discuss any questions you have with your health care provider. Document Released: 03/08/2001 Document Revised: 11/10/2015 Document Reviewed: 07/29/2014 Elsevier Interactive Patient Education  2018 [employer] Smoking  Tobacco Information Smoking tobacco will very likely harm your health. Tobacco contains a poisonous (toxic), colorless chemical called nicotine. Nicotine affects the brain and makes tobacco addictive. This change in your brain can make it hard to stop smoking. Tobacco also has other toxic chemicals that can hurt your body and raise your risk of many cancers. How can smoking tobacco affect me? Smoking tobacco can increase your chances of having serious health conditions, such as:  Cancer. Smoking is most commonly associated with lung cancer, but can lead to cancer in other parts of the body.  Chronic obstructive pulmonary disease (COPD). This is a long-term lung condition that makes it hard to breathe. It also gets worse over time.  High blood pressure (hypertension), heart disease, stroke, or heart attack.  Lung infections, such as pneumonia.  Cataracts. This is when the lenses in the eyes become clouded.  Digestive problems. This may include peptic ulcers, heartburn, and gastroesophageal reflux disease (GERD).  Oral health problems, such as gum disease and tooth loss.  Loss  of taste and smell.  Smoking can affect your appearance by causing:  Wrinkles.  Yellow or stained teeth, fingers, and fingernails.  Smoking tobacco can also affect your social life.  Many workplaces, [employer], hotels, and public places are tobacco-free. This means that you may experience challenges in finding places to smoke when away from home.  The cost of a smoking habit can be expensive. Expenses for someone who smokes come in two ways: ? You spend money on a regular basis to buy tobacco. ? Your health care costs in the long-term are higher if you smoke.  Tobacco smoke can also affect the health of those around you. Children of smokers have greater chances of: ? Sudden infant death syndrome (SIDS). ? Ear infections. ? Lung infections.  What lifestyle changes can be made?  Do not start smoking. Quit if you already do.  To quit smoking: ? Make a plan to quit smoking and commit yourself to it. Look for programs to help you and ask your health care provider for recommendations and ideas. ? Talk with your health care provider about using nicotine replacement medicines to help you quit. Medicine replacement medicines include gum, lozenges, patches, sprays, or pills. ? Do not replace cigarette smoking with electronic cigarettes, which are commonly called e-cigarettes. The safety of e-cigarettes is not known, and some may contain harmful chemicals. ? Avoid places, people, or situations that tempt you to smoke. ? If you try to quit but return to smoking, don't give up hope. It is very common for people to try a number of times before they fully succeed. When you feel ready again, give it another try.  Quitting smoking might affect the way you eat as well as your weight. Be prepared to monitor your eating habits. Get support in planning and following a healthy diet.  Ask your health care provider about having regular tests (screenings) to check for cancer. This may include blood tests,  imaging tests, and other tests.  Exercise regularly. Consider taking walks, joining a gym, or doing yoga or exercise classes.  Develop skills to manage your stress. These skills include meditation. What are the benefits of quitting smoking? By quitting smoking, you may:  Lower your risk of getting cancer and other diseases caused by smoking.  Live longer.  Breathe better.  Lower your blood pressure and heart rate.  Stop your addiction to tobacco.  Stop creating secondhand smoke that hurts other people.  Improve your  sense of taste and smell.  Look better over time, due to having fewer wrinkles and less staining.  What can happen if changes are not made? If you do not stop smoking, you may:  Get cancer and other diseases.  Develop COPD or other long-term (chronic) lung conditions.  Develop serious problems with your heart and blood vessels (cardiovascular system).  Need more tests to screen for problems caused by smoking.  Have higher, long-term healthcare costs from medicines or treatments related to smoking.  Continue to have worsening changes in your lungs, mouth, and nose.  Where to find support: To get support to quit smoking, consider:  Asking your health care provider for more information and resources.  Taking classes to learn more about quitting smoking.  Looking for local organizations that offer resources about quitting smoking.  Joining a support group for people who want to quit smoking in your local community.  Where to find more information: You may find more information about quitting smoking from:  HelpGuide.org: www.helpguide.org/articles/addictions/how-to-quit-smoking.htm  BankRights.[employer]: smokefree.gov  American Lung Association: www.lung.org  Contact a health care provider if:  You have problems breathing.  Your lips, nose, or fingers turn blue.  You have chest pain.  You are coughing up blood.  You feel faint or you pass  out.  You have other noticeable changes that cause you to worry. Summary  Smoking tobacco can negatively affect your health, the health of those around you, your finances, and your social life.  Do not start smoking. Quit if you already do. If you need help quitting, ask your health care provider.  Think about joining a support group for people who want to quit smoking in your local community. There are many effective programs that will help you to quit this behavior. This information is not intended to replace advice given to you by your health care provider. Make sure you discuss any questions you have with your health care provider. Document Released: 03/29/2016 Document Revised: 03/29/2016 Document Reviewed: 03/29/2016 Elsevier Interactive Patient Education  [employer].

## 2017-04-13 NOTE — Progress Notes (Signed)
Patient is here for a follow-up. Patient stated is here for hypertension. Patient's spouse stated he sometimes get nervous and anxious. Patient stated his foot swell and may due to a fluid pill one time.

## 2017-05-08 ENCOUNTER — Ambulatory Visit: Payer: No Typology Code available for payment source | Attending: Nurse Practitioner

## 2017-05-19 ENCOUNTER — Encounter: Payer: Self-pay | Admitting: Nurse Practitioner

## 2017-05-19 ENCOUNTER — Ambulatory Visit: Payer: Self-pay | Attending: Nurse Practitioner | Admitting: Nurse Practitioner

## 2017-05-19 VITALS — BP 188/108 | HR 65 | Temp 97.7°F | Ht 71.0 in | Wt 231.4 lb

## 2017-05-19 DIAGNOSIS — F419 Anxiety disorder, unspecified: Secondary | ICD-10-CM | POA: Insufficient documentation

## 2017-05-19 DIAGNOSIS — F17210 Nicotine dependence, cigarettes, uncomplicated: Secondary | ICD-10-CM | POA: Insufficient documentation

## 2017-05-19 DIAGNOSIS — I4510 Unspecified right bundle-branch block: Secondary | ICD-10-CM | POA: Insufficient documentation

## 2017-05-19 DIAGNOSIS — Z9889 Other specified postprocedural states: Secondary | ICD-10-CM | POA: Insufficient documentation

## 2017-05-19 DIAGNOSIS — I714 Abdominal aortic aneurysm, without rupture: Secondary | ICD-10-CM | POA: Insufficient documentation

## 2017-05-19 DIAGNOSIS — F17200 Nicotine dependence, unspecified, uncomplicated: Secondary | ICD-10-CM

## 2017-05-19 DIAGNOSIS — E785 Hyperlipidemia, unspecified: Secondary | ICD-10-CM | POA: Insufficient documentation

## 2017-05-19 DIAGNOSIS — I10 Essential (primary) hypertension: Secondary | ICD-10-CM | POA: Insufficient documentation

## 2017-05-19 DIAGNOSIS — Z79899 Other long term (current) drug therapy: Secondary | ICD-10-CM | POA: Insufficient documentation

## 2017-05-19 DIAGNOSIS — Z8249 Family history of ischemic heart disease and other diseases of the circulatory system: Secondary | ICD-10-CM | POA: Insufficient documentation

## 2017-05-19 MED ORDER — LOSARTAN POTASSIUM-HCTZ 100-25 MG PO TABS: 1.0000 | ORAL_TABLET | Freq: Every day | ORAL | 3 refills | Status: DC

## 2017-05-19 MED ORDER — CLONIDINE HCL 0.1 MG PO TABS
0.2000 mg | ORAL_TABLET | Freq: Once | ORAL | Status: AC
  Administered 2017-05-19: 0.2 mg via ORAL

## 2017-05-19 MED ORDER — HYDROXYZINE HCL 25 MG PO TABS: 25.0000 mg | ORAL_TABLET | Freq: Three times a day (TID) | ORAL | 1 refills | Status: DC | PRN

## 2017-05-19 MED FILL — LOSARTAN-HCTZ 100-25 MG TAB: 100-25 | 30 days supply | Qty: 30 | Fill #0

## 2017-05-19 MED FILL — hydrOXYzine HCL 25 MG TABS: 25 | 20 days supply | Qty: 60 | Fill #0

## 2017-05-19 NOTE — Progress Notes (Signed)
Assessment & Plan:  Randall Ferrell was seen today for establish care.  Diagnoses and all orders for this visit:  Essential hypertension -     cloNIDine (CATAPRES) tablet 0.2 mg -     losartan-hydrochlorothiazide (HYZAAR) 100-25 MG tablet; Take 1 tablet by mouth daily. Continue all antihypertensives as prescribed.  Remember to bring in your blood pressure log with you for your follow up appointment.  DASH/Mediterranean Diets are healthier choices for HTN.   Tobacco Dependence Randall Ferrell was counseled on the dangers of tobacco use, and was advised to quit. Reviewed strategies to maximize success, including removing cigarettes and smoking materials from environment, stress management and support of family/friends as well as pharmacological alternatives including: Wellbutrin, Chantix, Nicotine patch, Nicotine gum or lozenges. Smoking cessation support: smoking cessation hotline: 1-800-QUIT-NOW.  Smoking cessation classes are also available through [facility]. Call [phone] or visit our website at [email or web address].   Spent 3 minutes counseling on smoking cessation and patient is not ready to quit. Anxiety -     hydrOXYzine (ATARAX/VISTARIL) 25 MG tablet; Take 1 tablet (25 mg total) by mouth 3 (three) times daily as needed for anxiety.  Patient has been counseled on age-appropriate routine health concerns for screening and prevention. These are reviewed and up-to-date. Referrals have been placed accordingly. Immunizations are up-to-date or declined.    Subjective:   Chief Complaint  Patient presents with  . Establish Care    Patient is here to establish care for hypertension.    HPI Randall Ferrell 52 y.o. male presents to office today establish care.   Essential Hypertension Chronic. He was started restarted on amlodipine 10mg  at his last office visit on 04-13-2017. At that time he was taking losartan 100mg . Today he endorses medication compliance however his  blood pressure is still not controlled. He is not diet or exercise compliant. He is not checking his blood pressure at home. Will add HCTZ today for better control. Denies chest pain, shortness of breath, palpitations, lightheadedness, dizziness, headaches or BLE edema. HE HAS NOT STOPPED SMOKING  BP Readings from Last 3 Encounters:  05/19/17 (!) 188/108  04/13/17 (!) 178/97  04/02/17 (!) 167/101    Anxiety Patient complains of increased anxiety.  He has the following symptoms: feelings of losing control, irritable. Onset of symptoms was approximately several months ago, unchanged since that time. He denies current suicidal and homicidal ideation. Family history significant for no psychiatric illness.Possible organic causes contributing are: none. Risk factors: none Previous treatment includes NONE   Depression screen [facility] 2/9 04/13/2017  Decreased Interest 2  Down, Depressed, Hopeless 2  PHQ - 2 Score 4  Altered sleeping 0  Tired, decreased energy 2  Change in appetite 0  Feeling bad or failure about yourself  1  Trouble concentrating 2  Moving slowly or fidgety/restless 2  Suicidal thoughts 0  PHQ-9 Score 11     Review of Systems  Constitutional: Negative for fever, malaise/fatigue and weight loss.  HENT: Negative.  Negative for nosebleeds.   Eyes: Negative.  Negative for blurred vision, double vision and photophobia.  Respiratory: Negative.  Negative for cough and shortness of breath.   Cardiovascular: Negative.  Negative for chest pain, palpitations and leg swelling.  Gastrointestinal: Negative.  Negative for abdominal pain, constipation, diarrhea, heartburn, nausea and vomiting.  Musculoskeletal: Negative.  Negative for myalgias.  Neurological: Negative.  Negative for dizziness, focal weakness, seizures and headaches.  Endo/Heme/Allergies: Negative for environmental allergies.  Psychiatric/Behavioral: Negative for suicidal ideas.  The patient is nervous/anxious.     Past  Medical History:  Diagnosis Date  . AAA (abdominal aortic aneurysm) (HCC) per duplex at dr Jacinto Halim office 10-29-2014   moderate distal aorta  w/ bilateral iliac artery involvement 2.45cm X 3.43cm/  involves bilateral iliac vessels 12mm  . Heart murmur   . Hyperlipidemia   . Hypertension   . Incomplete right bundle branch block   . Left hydrocele     Past Surgical History:  Procedure Laterality Date  . CARDIOVASCULAR STRESS TEST  09-22-2014  dr Jacinto Halim   normal perfusion study/  normal LV function and wall motion , ef 62%  . HYDROCELE EXCISION Left 02/02/2015   Procedure: HYDROCELECTOMY ADULT;  Surgeon: Hildred Laser, MD;  Location: [facility];  Service: Urology;  Laterality: Left;  . TRANSTHORACIC ECHOCARDIOGRAM  10-29-2014   dr Jacinto Halim   moderate concentric LVH, ef 60%/  mild AR, MR, and TR/  IVC borderline dilated w/ respiratory variation    Family History  Problem Relation Age of Onset  . Heart disease Father   . Heart disease Brother     Social History Reviewed with no changes to be made today.   Outpatient Medications Prior to Visit  Medication Sig Dispense Refill  . amLODipine (NORVASC) 10 MG tablet Take 1 tablet (10 mg total) by mouth daily. 90 tablet 3  . losartan (COZAAR) 100 MG tablet Take 1 tablet (100 mg total) by mouth daily. 30 tablet 5   No facility-administered medications prior to visit.     No Known Allergies     Objective:    BP (!) 188/108 (BP Location: Left Arm, Cuff Size: Large)   Pulse 65   Temp 97.7 F (36.5 C) (Oral)   Ht 5\' 11"  (1.803 m)   Wt 231 lb 6.4 oz (105 kg)   SpO2 98%   BMI 32.27 kg/m  Wt Readings from Last 3 Encounters:  05/19/17 231 lb 6.4 oz (105 kg)  04/13/17 230 lb 9.6 oz (104.6 kg)  02/02/15 225 lb (102.1 kg)    Physical Exam  Constitutional: He is oriented to person, place, and time. He appears well-developed and well-nourished. He is cooperative.  HENT:  Head: Normocephalic and atraumatic.  Eyes: EOM  are normal.  Neck: Normal range of motion.  Cardiovascular: Normal rate, regular rhythm, normal heart sounds and intact distal pulses. Exam reveals no gallop and no friction rub.  No murmur heard. Pulmonary/Chest: Effort normal and breath sounds normal. No tachypnea. No respiratory distress. He has no decreased breath sounds. He has no wheezes. He has no rhonchi. He has no rales. He exhibits no tenderness.  Abdominal: Soft. Bowel sounds are normal.  Musculoskeletal: Normal range of motion. He exhibits no edema.  Neurological: He is alert and oriented to person, place, and time. Coordination normal.  Skin: Skin is warm and dry.  Psychiatric: He has a normal mood and affect. His speech is normal and behavior is normal. Judgment and thought content normal.  Nursing note and vitals reviewed.     Patient has been counseled extensively about nutrition and exercise as well as the importance of adherence with medications and regular follow-up. The patient was given clear instructions to go to ER or return to medical center if symptoms don't improve, worsen or new problems develop. The patient verbalized understanding.   Follow-up: Return in about 3 weeks (around 06/09/2017) for BP recheck.   Claiborne Rigg, FNP-BC [facility]  [place], [place] [phone]   05/20/2017, 8:06 PM

## 2017-05-19 NOTE — Patient Instructions (Addendum)
DASH Eating Plan DASH stands for "Dietary Approaches to Stop Hypertension." The DASH eating plan is a healthy eating plan that has been shown to reduce high blood pressure (hypertension). It may also reduce your risk for type 2 diabetes, heart disease, and stroke. The DASH eating plan may also help with weight loss. What are tips for following this plan? General guidelines  Avoid eating more than 2,300 mg (milligrams) of salt (sodium) a day. If you have hypertension, you may need to reduce your sodium intake to 1,500 mg a day.  Limit alcohol intake to no more than 1 drink a day for nonpregnant women and 2 drinks a day for men. One drink equals 12 oz of beer, 5 oz of wine, or 1 oz of hard liquor.  Work with your health care provider to maintain a healthy body weight or to lose weight. Ask what an ideal weight is for you.  Get at least 30 minutes of exercise that causes your heart to beat faster (aerobic exercise) most days of the week. Activities may include walking, swimming, or biking.  Work with your health care provider or diet and nutrition specialist (dietitian) to adjust your eating plan to your individual calorie needs. Reading food labels  Check food labels for the amount of sodium per serving. Choose foods with less than 5 percent of the Daily Value of sodium. Generally, foods with less than 300 mg of sodium per serving fit into this eating plan.  To find whole grains, look for the word "whole" as the first word in the ingredient list. Shopping  Buy products labeled as "low-sodium" or "no salt added."  Buy fresh foods. Avoid canned foods and premade or frozen meals. Cooking  Avoid adding salt when cooking. Use salt-free seasonings or herbs instead of table salt or sea salt. Check with your health care provider or pharmacist before using salt substitutes.  Do not fry foods. Cook foods using healthy methods such as baking, boiling, grilling, and broiling instead.  Cook with  heart-healthy oils, such as olive, canola, soybean, or sunflower oil. Meal planning   Eat a balanced diet that includes: ? 5 or more servings of fruits and vegetables each day. At each meal, try to fill half of your plate with fruits and vegetables. ? Up to 6-8 servings of whole grains each day. ? Less than 6 oz of lean meat, poultry, or fish each day. A 3-oz serving of meat is about the same size as a deck of cards. One egg equals 1 oz. ? 2 servings of low-fat dairy each day. ? A serving of nuts, seeds, or beans 5 times each week. ? Heart-healthy fats. Healthy fats called Omega-3 fatty acids are found in foods such as flaxseeds and coldwater fish, like sardines, salmon, and mackerel.  Limit how much you eat of the following: ? Canned or prepackaged foods. ? Food that is high in trans fat, such as fried foods. ? Food that is high in saturated fat, such as fatty meat. ? Sweets, desserts, sugary drinks, and other foods with added sugar. ? Full-fat dairy products.  Do not salt foods before eating.  Try to eat at least 2 vegetarian meals each week.  Eat more home-cooked food and less restaurant, buffet, and fast food.  When eating at a restaurant, ask that your food be prepared with less salt or no salt, if possible. What foods are recommended? The items listed may not be a complete list. Talk with your dietitian about what   dietary choices are best for you. Grains Whole-grain or whole-wheat bread. Whole-grain or whole-wheat pasta. Brown rice. Oatmeal. Quinoa. Bulgur. Whole-grain and low-sodium cereals. Pita bread. Low-fat, low-sodium crackers. Whole-wheat flour tortillas. Vegetables Fresh or frozen vegetables (raw, steamed, roasted, or grilled). Low-sodium or reduced-sodium tomato and vegetable juice. Low-sodium or reduced-sodium tomato sauce and tomato paste. Low-sodium or reduced-sodium canned vegetables. Fruits All fresh, dried, or frozen fruit. Canned fruit in natural juice (without  added sugar). Meat and other protein foods Skinless chicken or turkey. Ground chicken or turkey. Pork with fat trimmed off. Fish and seafood. Egg whites. Dried beans, peas, or lentils. Unsalted nuts, nut butters, and seeds. Unsalted canned beans. Lean cuts of beef with fat trimmed off. Low-sodium, lean deli meat. Dairy Low-fat (1%) or fat-free (skim) milk. Fat-free, low-fat, or reduced-fat cheeses. Nonfat, low-sodium ricotta or cottage cheese. Low-fat or nonfat yogurt. Low-fat, low-sodium cheese. Fats and oils Soft margarine without trans fats. Vegetable oil. Low-fat, reduced-fat, or light mayonnaise and salad dressings (reduced-sodium). Canola, safflower, olive, soybean, and sunflower oils. Avocado. Seasoning and other foods Herbs. Spices. Seasoning mixes without salt. Unsalted popcorn and pretzels. Fat-free sweets. What foods are not recommended? The items listed may not be a complete list. Talk with your dietitian about what dietary choices are best for you. Grains Baked goods made with fat, such as croissants, muffins, or some breads. Dry pasta or rice meal packs. Vegetables Creamed or fried vegetables. Vegetables in a cheese sauce. Regular canned vegetables (not low-sodium or reduced-sodium). Regular canned tomato sauce and paste (not low-sodium or reduced-sodium). Regular tomato and vegetable juice (not low-sodium or reduced-sodium). Pickles. Olives. Fruits Canned fruit in a light or heavy syrup. Fried fruit. Fruit in cream or butter sauce. Meat and other protein foods Fatty cuts of meat. Ribs. Fried meat. Bacon. Sausage. Bologna and other processed lunch meats. Salami. Fatback. Hotdogs. Bratwurst. Salted nuts and seeds. Canned beans with added salt. Canned or smoked fish. Whole eggs or egg yolks. Chicken or turkey with skin. Dairy Whole or 2% milk, cream, and half-and-half. Whole or full-fat cream cheese. Whole-fat or sweetened yogurt. Full-fat cheese. Nondairy creamers. Whipped toppings.  Processed cheese and cheese spreads. Fats and oils Butter. Stick margarine. Lard. Shortening. Ghee. Bacon fat. Tropical oils, such as coconut, palm kernel, or palm oil. Seasoning and other foods Salted popcorn and pretzels. Onion salt, garlic salt, seasoned salt, table salt, and sea salt. Worcestershire sauce. Tartar sauce. Barbecue sauce. Teriyaki sauce. Soy sauce, including reduced-sodium. Steak sauce. Canned and packaged gravies. Fish sauce. Oyster sauce. Cocktail sauce. Horseradish that you find on the shelf. Ketchup. Mustard. Meat flavorings and tenderizers. Bouillon cubes. Hot sauce and Tabasco sauce. Premade or packaged marinades. Premade or packaged taco seasonings. Relishes. Regular salad dressings. Where to find more information:  National Heart, Lung, and Blood Institute: www.nhlbi.nih.gov  American Heart Association: www.heart.org Summary  The DASH eating plan is a healthy eating plan that has been shown to reduce high blood pressure (hypertension). It may also reduce your risk for type 2 diabetes, heart disease, and stroke.  With the DASH eating plan, you should limit salt (sodium) intake to 2,300 mg a day. If you have hypertension, you may need to reduce your sodium intake to 1,500 mg a day.  When on the DASH eating plan, aim to eat more fresh fruits and vegetables, whole grains, lean proteins, low-fat dairy, and heart-healthy fats.  Work with your health care provider or diet and nutrition specialist (dietitian) to adjust your eating plan to your individual   calorie needs. This information is not intended to replace advice given to you by your health care provider. Make sure you discuss any questions you have with your health care provider. Document Released: 03/03/2011 Document Revised: 03/07/2016 Document Reviewed: 03/07/2016 Elsevier Interactive Patient Education  2018 [employer]  Hypertension Hypertension, commonly called high blood pressure, is when the force of blood  pumping through the arteries is too strong. The arteries are the blood vessels that carry blood from the heart throughout the body. Hypertension forces the heart to work harder to pump blood and may cause arteries to become narrow or stiff. Having untreated or uncontrolled hypertension can cause heart attacks, strokes, kidney disease, and other problems. A blood pressure reading consists of a higher number over a lower number. Ideally, your blood pressure should be below 120/80. The first ("top") number is called the systolic pressure. It is a measure of the pressure in your arteries as your heart beats. The second ("bottom") number is called the diastolic pressure. It is a measure of the pressure in your arteries as the heart relaxes. What are the causes? The cause of this condition is not known. What increases the risk? Some risk factors for high blood pressure are under your control. Others are not. Factors you can change  Smoking.  Having type 2 diabetes mellitus, high cholesterol, or both.  Not getting enough exercise or physical activity.  Being overweight.  Having too much fat, sugar, calories, or salt (sodium) in your diet.  Drinking too much alcohol. Factors that are difficult or impossible to change  Having chronic kidney disease.  Having a family history of high blood pressure.  Age. Risk increases with age.  Race. You may be at higher risk if you are African-American.  Gender. Men are at higher risk than women before age 45. After age 65, women are at higher risk than men.  Having obstructive sleep apnea.  Stress. What are the signs or symptoms? Extremely high blood pressure (hypertensive crisis) may cause:  Headache.  Anxiety.  Shortness of breath.  Nosebleed.  Nausea and vomiting.  Severe chest pain.  Jerky movements you cannot control (seizures).  How is this diagnosed? This condition is diagnosed by measuring your blood pressure while you are  seated, with your arm resting on a surface. The cuff of the blood pressure monitor will be placed directly against the skin of your upper arm at the level of your heart. It should be measured at least twice using the same arm. Certain conditions can cause a difference in blood pressure between your right and left arms. Certain factors can cause blood pressure readings to be lower or higher than normal (elevated) for a short period of time:  When your blood pressure is higher when you are in a health care provider's office than when you are at home, this is called white coat hypertension. Most people with this condition do not need medicines.  When your blood pressure is higher at home than when you are in a health care provider's office, this is called masked hypertension. Most people with this condition may need medicines to control blood pressure.  If you have a high blood pressure reading during one visit or you have normal blood pressure with other risk factors:  You may be asked to return on a different day to have your blood pressure checked again.  You may be asked to monitor your blood pressure at home for 1 week or longer.  If you are   diagnosed with hypertension, you may have other blood or imaging tests to help your health care provider understand your overall risk for other conditions. How is this treated? This condition is treated by making healthy lifestyle changes, such as eating healthy foods, exercising more, and reducing your alcohol intake. Your health care provider may prescribe medicine if lifestyle changes are not enough to get your blood pressure under control, and if:  Your systolic blood pressure is above 130.  Your diastolic blood pressure is above 80.  Your personal target blood pressure may vary depending on your medical conditions, your age, and other factors. Follow these instructions at home: Eating and drinking  Eat a diet that is high in fiber and potassium,  and low in sodium, added sugar, and fat. An example eating plan is called the DASH (Dietary Approaches to Stop Hypertension) diet. To eat this way: ? Eat plenty of fresh fruits and vegetables. Try to fill half of your plate at each meal with fruits and vegetables. ? Eat whole grains, such as whole wheat pasta, brown rice, or whole grain bread. Fill about one quarter of your plate with whole grains. ? Eat or drink low-fat dairy products, such as skim milk or low-fat yogurt. ? Avoid fatty cuts of meat, processed or cured meats, and poultry with skin. Fill about one quarter of your plate with lean proteins, such as fish, chicken without skin, beans, eggs, and tofu. ? Avoid premade and processed foods. These tend to be higher in sodium, added sugar, and fat.  Reduce your daily sodium intake. Most people with hypertension should eat less than 1,500 mg of sodium a day.  Limit alcohol intake to no more than 1 drink a day for nonpregnant women and 2 drinks a day for men. One drink equals 12 oz of beer, 5 oz of wine, or 1 oz of hard liquor. Lifestyle  Work with your health care provider to maintain a healthy body weight or to lose weight. Ask what an ideal weight is for you.  Get at least 30 minutes of exercise that causes your heart to beat faster (aerobic exercise) most days of the week. Activities may include walking, swimming, or biking.  Include exercise to strengthen your muscles (resistance exercise), such as pilates or lifting weights, as part of your weekly exercise routine. Try to do these types of exercises for 30 minutes at least 3 days a week.  Do not use any products that contain nicotine or tobacco, such as cigarettes and e-cigarettes. If you need help quitting, ask your health care provider.  Monitor your blood pressure at home as told by your health care provider.  Keep all follow-up visits as told by your health care provider. This is important. Medicines  Take over-the-counter and  prescription medicines only as told by your health care provider. Follow directions carefully. Blood pressure medicines must be taken as prescribed.  Do not skip doses of blood pressure medicine. Doing this puts you at risk for problems and can make the medicine less effective.  Ask your health care provider about side effects or reactions to medicines that you should watch for. Contact a health care provider if:  You think you are having a reaction to a medicine you are taking.  You have headaches that keep coming back (recurring).  You feel dizzy.  You have swelling in your ankles.  You have trouble with your vision. Get help right away if:  You develop a severe headache or confusion.    You have unusual weakness or numbness.  You feel faint.  You have severe pain in your chest or abdomen.  You vomit repeatedly.  You have trouble breathing. Summary  Hypertension is when the force of blood pumping through your arteries is too strong. If this condition is not controlled, it may put you at risk for serious complications.  Your personal target blood pressure may vary depending on your medical conditions, your age, and other factors. For most people, a normal blood pressure is less than 120/80.  Hypertension is treated with lifestyle changes, medicines, or a combination of both. Lifestyle changes include weight loss, eating a healthy, low-sodium diet, exercising more, and limiting alcohol. This information is not intended to replace advice given to you by your health care provider. Make sure you discuss any questions you have with your health care provider. Document Released: 03/14/2005 Document Revised: 02/10/2016 Document Reviewed: 02/10/2016 Elsevier Interactive Patient Education  2018 [employer] Inc.  

## 2017-05-20 ENCOUNTER — Encounter: Payer: Self-pay | Admitting: Nurse Practitioner

## 2017-06-13 ENCOUNTER — Telehealth: Payer: Self-pay | Admitting: Nurse Practitioner

## 2017-06-13 NOTE — Telephone Encounter (Signed)
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 His bp was 183/113. She stated he needs mental help and he is violent. She states he also has an alcohol problem. Melissa stated he has told her that he doesn't fully understand things on multiple occasions. Melissa stated she did not want her name to come up due to her being afraid he may kill her. Melissa stated that she just wanted to let someone know before his appointment tomorrow. With any further questions you are more than welcome to contact Melissa.

## 2017-06-14 ENCOUNTER — Other Ambulatory Visit: Payer: Self-pay

## 2017-06-14 ENCOUNTER — Ambulatory Visit: Payer: Self-pay | Attending: Nurse Practitioner | Admitting: *Deleted

## 2017-06-14 VITALS — BP 119/77 | HR 80 | Wt 231.8 lb

## 2017-06-14 DIAGNOSIS — Z0130 Encounter for examination of blood pressure without abnormal findings: Secondary | ICD-10-CM | POA: Insufficient documentation

## 2017-06-14 DIAGNOSIS — F419 Anxiety disorder, unspecified: Secondary | ICD-10-CM

## 2017-06-14 DIAGNOSIS — I10 Essential (primary) hypertension: Secondary | ICD-10-CM

## 2017-06-14 MED ORDER — TETANUS-DIPHTH-ACELL PERTUSSIS 5-2.5-18.5 LF-MCG/0.5 IM SUSP: 0.5000 mL | Freq: Once | INTRAMUSCULAR | 0 refills | Status: AC

## 2017-06-14 MED ORDER — HYDROXYZINE HCL 50 MG PO TABS: 50.0000 mg | ORAL_TABLET | Freq: Three times a day (TID) | ORAL | 0 refills | Status: DC

## 2017-06-14 MED FILL — AMLODIPINE BESYLATE 10 MG T: 10 | 30 days supply | Qty: 30 | Fill #1

## 2017-06-14 MED FILL — LOSARTAN-HCTZ 100-25 MG TAB: 100-25 | 30 days supply | Qty: 30 | Fill #1

## 2017-06-14 MED FILL — HYDROXYZINE PAM 50 MG CAP: 50 | 14 days supply | Qty: 42 | Fill #0

## 2017-06-14 NOTE — Progress Notes (Signed)
[facility].  At last office visit on 05/19/2017, pt blood pressure reading was 188/108.  He returns today for nurse visit for blood pressure check.   Pt denies chest pain, SOB, HA, dizziness, or blurred vision, or BLE edema swelling.  Verified medication with patient. He request increase on Hydroxyzine today. He adds that  He thinks medication is helping him. He also states he took blood pressure medication  This morning.  Blood pressure reading at nurse visit today:  169/78 122/77 119/77

## 2017-06-14 NOTE — Telephone Encounter (Signed)
Patient came in for a Nurse Visit today. Noted.

## 2017-06-15 MED FILL — $BOOSTRIX VACCINE SYRINGE: 5-2.5-18.5 | 1 days supply | Qty: 1 | Fill #0

## 2017-06-29 MED FILL — HYDROXYZINE PAM 50 MG CAP: 50 | 14 days supply | Qty: 42 | Fill #1

## 2017-07-18 MED FILL — AMLODIPINE BESYLATE 10 MG T: 10 | 30 days supply | Qty: 30 | Fill #2

## 2017-07-18 MED FILL — LOSARTAN-HCTZ 100-25 MG TAB: 100-25 | 30 days supply | Qty: 30 | Fill #2

## 2017-08-01 ENCOUNTER — Other Ambulatory Visit: Payer: Self-pay | Admitting: Nurse Practitioner

## 2017-08-01 DIAGNOSIS — F419 Anxiety disorder, unspecified: Secondary | ICD-10-CM

## 2017-08-01 MED ORDER — HYDROXYZINE HCL 50 MG PO TABS: 50.0000 mg | ORAL_TABLET | Freq: Three times a day (TID) | ORAL | 1 refills | Status: DC

## 2017-08-01 MED FILL — hydrOXYzine HCL 50 MG TABS: 50 | 30 days supply | Qty: 90 | Fill #0

## 2017-08-09 ENCOUNTER — Ambulatory Visit (INDEPENDENT_AMBULATORY_CARE_PROVIDER_SITE_OTHER): Payer: Self-pay | Admitting: Nurse Practitioner

## 2017-08-09 ENCOUNTER — Other Ambulatory Visit: Payer: Self-pay

## 2017-08-09 ENCOUNTER — Encounter (INDEPENDENT_AMBULATORY_CARE_PROVIDER_SITE_OTHER): Payer: Self-pay | Admitting: Nurse Practitioner

## 2017-08-09 VITALS — BP 139/86 | HR 78 | Temp 97.9°F | Wt 233.8 lb

## 2017-08-09 DIAGNOSIS — Z0000 Encounter for general adult medical examination without abnormal findings: Secondary | ICD-10-CM

## 2017-08-09 DIAGNOSIS — F419 Anxiety disorder, unspecified: Secondary | ICD-10-CM

## 2017-08-09 DIAGNOSIS — I10 Essential (primary) hypertension: Secondary | ICD-10-CM

## 2017-08-09 DIAGNOSIS — F17200 Nicotine dependence, unspecified, uncomplicated: Secondary | ICD-10-CM

## 2017-08-09 DIAGNOSIS — Z1211 Encounter for screening for malignant neoplasm of colon: Secondary | ICD-10-CM

## 2017-08-09 NOTE — Patient Instructions (Signed)
Steps to Quit Smoking Smoking tobacco can be bad for your health. It can also affect almost every organ in your body. Smoking puts you and people around you at risk for many serious long-lasting (chronic) diseases. Quitting smoking is hard, but it is one of the best things that you can do for your health. It is never too late to quit. What are the benefits of quitting smoking? When you quit smoking, you lower your risk for getting serious diseases and conditions. They can include:  Lung cancer or lung disease.  Heart disease.  Stroke.  Heart attack.  Not being able to have children (infertility).  Weak bones (osteoporosis) and broken bones (fractures).  If you have coughing, wheezing, and shortness of breath, those symptoms may get better when you quit. You may also get sick less often. If you are pregnant, quitting smoking can help to lower your chances of having a baby of low birth weight. What can I do to help me quit smoking? Talk with your doctor about what can help you quit smoking. Some things you can do (strategies) include:  Quitting smoking totally, instead of slowly cutting back how much you smoke over a period of time.  Going to in-person counseling. You are more likely to quit if you go to many counseling sessions.  Using resources and support systems, such as: ? Online chats with a counselor. ? Phone quitlines. ? Printed self-help materials. ? Support groups or group counseling. ? Text messaging programs. ? Mobile phone apps or applications.  Taking medicines. Some of these medicines may have nicotine in them. If you are pregnant or breastfeeding, do not take any medicines to quit smoking unless your doctor says it is okay. Talk with your doctor about counseling or other things that can help you.  Talk with your doctor about using more than one strategy at the same time, such as taking medicines while you are also going to in-person counseling. This can help make  quitting easier. What things can I do to make it easier to quit? Quitting smoking might feel very hard at first, but there is a lot that you can do to make it easier. Take these steps:  Talk to your family and friends. Ask them to support and encourage you.  Call phone quitlines, reach out to support groups, or work with a counselor.  Ask people who smoke to not smoke around you.  Avoid places that make you want (trigger) to smoke, such as: ? Bars. ? Parties. ? Smoke-break areas at work.  Spend time with people who do not smoke.  Lower the stress in your life. Stress can make you want to smoke. Try these things to help your stress: ? Getting regular exercise. ? Deep-breathing exercises. ? Yoga. ? Meditating. ? Doing a body scan. To do this, close your eyes, focus on one area of your body at a time from head to toe, and notice which parts of your body are tense. Try to relax the muscles in those areas.  Download or buy apps on your mobile phone or tablet that can help you stick to your quit plan. There are many free apps, such as QuitGuide from the CDC (Centers for Disease Control and Prevention). You can find more support from smokefree.gov and other websites.  This information is not intended to replace advice given to you by your health care provider. Make sure you discuss any questions you have with your health care provider. Document Released: 01/08/2009 Document   Revised: 11/10/2015 Document Reviewed: 07/29/2014 Elsevier Interactive Patient Education  2018 [employer] Inc.  

## 2017-08-09 NOTE — Progress Notes (Signed)
Assessment & Plan:  Randall Ferrell was seen today for follow-up.  Diagnoses and all orders for this visit:  Essential hypertension -     Basic metabolic panel -     CBC Continue all antihypertensives as prescribed.  Remember to bring in your blood pressure log with you for your follow up appointment.  DASH/Mediterranean Diets are healthier choices for HTN.   Anxiety Continue hydroxyzine as prescribed.  Colon cancer screening -     Ambulatory referral to Gastroenterology  Routine adult health maintenance -     PSA -     Hepatitis C Antibody   Tobacco Dependence Randall Ferrell was counseled on the dangers of tobacco use, and was advised to quit. Reviewed strategies to maximize success, including removing cigarettes and smoking materials from environment, stress management and support of family/friends as well as pharmacological alternatives including: Wellbutrin, Chantix, Nicotine patch, Nicotine gum or lozenges. Smoking cessation support: smoking cessation hotline: 1-800-QUIT-NOW.  Smoking cessation classes are also available through [facility]. Call [phone] or visit our website at [email or web address].   A total of 3 minutes was spent on counseling for smoking cessation and Randall Ferrell is not ready to quit.    Patient has been counseled on age-appropriate routine health concerns for screening and prevention. These are reviewed and up-to-date. Referrals have been placed accordingly. Immunizations are up-to-date or declined.    Subjective:   Chief Complaint  Patient presents with  . Follow-up    BP check    HPI Randall Ferrell 52 y.o. male presents to office today to follow-up for hypertension and anxiety.  Essential Hypertension Chronic. Well controlled today.  Endorses medication compliance taking Hyzaar 100-25 mg daily and amlodipine 10 mg daily as prescribed.  Monitoring his sodium intake.  He is not exercise compliant. Denies chest pain, shortness of  breath, palpitations, lightheadedness, dizziness, headaches or visual disturbances.  He does endorse bilateral lower extremity edema with prolonged standing.  I have instructed him to purchase compression socks to wear for prolonged standing; mainly at work. BP Readings from Last 3 Encounters:  08/09/17 139/86  06/14/17 119/77  05/19/17 (!) 188/108   Anxiety Taking hydroxyzine 50 mg 3 times daily as needed which provides significant relief of his symptoms.  He denies any ideation.   Review of Systems  Constitutional: Negative for fever, malaise/fatigue and weight loss.  HENT: Negative.  Negative for nosebleeds.   Eyes: Negative.  Negative for blurred vision, double vision and photophobia.  Respiratory: Negative.  Negative for cough and shortness of breath.   Cardiovascular: Negative.  Negative for chest pain, palpitations and leg swelling.  Gastrointestinal: Negative.  Negative for heartburn, nausea and vomiting.  Musculoskeletal: Negative.  Negative for myalgias.  Neurological: Negative.  Negative for dizziness, focal weakness, seizures and headaches.  Psychiatric/Behavioral: Negative for suicidal ideas. The patient is nervous/anxious.     Past Medical History:  Diagnosis Date  . AAA (abdominal aortic aneurysm) (HCC) per duplex at dr Jacinto Halim office 10-29-2014   moderate distal aorta  w/ bilateral iliac artery involvement 2.45cm X 3.43cm/  involves bilateral iliac vessels 12mm  . Heart murmur   . Hyperlipidemia   . Hypertension   . Incomplete right bundle branch block   . Left hydrocele     Past Surgical History:  Procedure Laterality Date  . CARDIOVASCULAR STRESS TEST  09-22-2014  dr Jacinto Halim   normal perfusion study/  normal LV function and wall motion , ef 62%  . HYDROCELE EXCISION Left 02/02/2015  Procedure: HYDROCELECTOMY ADULT;  Surgeon: Hildred Laser, MD;  Location: [facility];  Service: Urology;  Laterality: Left;  . TRANSTHORACIC ECHOCARDIOGRAM   10-29-2014   dr Jacinto Halim   moderate concentric LVH, ef 60%/  mild AR, MR, and TR/  IVC borderline dilated w/ respiratory variation    Family History  Problem Relation Age of Onset  . Heart disease Father   . Heart disease Brother     Social History Reviewed with no changes to be made today.   Outpatient Medications Prior to Visit  Medication Sig Dispense Refill  . amLODipine (NORVASC) 10 MG tablet Take 1 tablet (10 mg total) by mouth daily. 90 tablet 3  . hydrOXYzine (ATARAX/VISTARIL) 50 MG tablet Take 1 tablet (50 mg total) by mouth 3 (three) times daily. 90 tablet 1  . losartan-hydrochlorothiazide (HYZAAR) 100-25 MG tablet Take 1 tablet by mouth daily. 90 tablet 3   No facility-administered medications prior to visit.     No Known Allergies     Objective:    BP 139/86 (BP Location: Right Arm, Patient Position: Sitting, Cuff Size: Large)   Pulse 78   Temp 97.9 F (36.6 C) (Oral)   Wt 233 lb 12.8 oz (106.1 kg)   SpO2 95%   BMI 32.61 kg/m  Wt Readings from Last 3 Encounters:  08/09/17 233 lb 12.8 oz (106.1 kg)  06/14/17 231 lb 12.8 oz (105.1 kg)  05/19/17 231 lb 6.4 oz (105 kg)    Physical Exam  Constitutional: He is oriented to person, place, and time. He appears well-developed and well-nourished. He is cooperative.  HENT:  Head: Normocephalic and atraumatic.  Eyes: EOM are normal.  Neck: Normal range of motion.  Cardiovascular: Normal rate, regular rhythm and normal heart sounds. Exam reveals no gallop and no friction rub.  No murmur heard. Pulmonary/Chest: Effort normal and breath sounds normal. No tachypnea. No respiratory distress. He has no decreased breath sounds. He has no wheezes. He has no rhonchi. He has no rales. He exhibits no tenderness.  Abdominal: Bowel sounds are normal.  Musculoskeletal: Normal range of motion. He exhibits no edema, tenderness or deformity.  Neurological: He is alert and oriented to person, place, and time. Coordination normal.  Skin:  Skin is warm and dry.  Psychiatric: He has a normal mood and affect. His behavior is normal. Judgment and thought content normal.  Nursing note and vitals reviewed.      Patient has been counseled extensively about nutrition and exercise as well as the importance of adherence with medications and regular follow-up. The patient was given clear instructions to go to ER or return to medical center if symptoms don't improve, worsen or new problems develop. The patient verbalized understanding.   Follow-up: Return in about 3 months (around 11/09/2017) for HTN/, Fasting labs.   Claiborne Rigg, FNP-BC [facility] [place], [place] [phone]   08/09/2017, 4:31 PM

## 2017-08-10 LAB — CBC
HEMATOCRIT: 41.2 % (ref 37.5–51.0)
HEMOGLOBIN: 14 g/dL (ref 13.0–17.7)
MCH: 28.7 pg (ref 26.6–33.0)
MCHC: 34 g/dL (ref 31.5–35.7)
MCV: 85 fL (ref 79–97)
Platelets: 272 10*3/uL (ref 150–379)
RBC: 4.87 x10E6/uL (ref 4.14–5.80)
RDW: 14.1 % (ref 12.3–15.4)
WBC: 8.7 10*3/uL (ref 3.4–10.8)

## 2017-08-10 LAB — BASIC METABOLIC PANEL
BUN / CREAT RATIO: 15 (ref 9–20)
BUN: 14 mg/dL (ref 6–24)
CO2: 22 mmol/L (ref 20–29)
CREATININE: 0.93 mg/dL (ref 0.76–1.27)
Calcium: 9.7 mg/dL (ref 8.7–10.2)
Chloride: 104 mmol/L (ref 96–106)
GFR calc Af Amer: 108 mL/min/{1.73_m2} (ref >59)
GFR calc non Af Amer: 93 mL/min/{1.73_m2} (ref >59)
GLUCOSE: 109 mg/dL — AB (ref 65–99)
Potassium: 4 mmol/L (ref 3.5–5.2)
Sodium: 139 mmol/L (ref 134–144)

## 2017-08-10 LAB — HEPATITIS C ANTIBODY: Hep C Virus Ab: <0.1 s/co ratio (ref 0.0–0.9)

## 2017-08-10 LAB — PSA: Prostate Specific Ag, Serum: 0.7 ng/mL (ref 0.0–4.0)

## 2017-08-16 MED FILL — LOSARTAN-HCTZ 100-25 MG TAB: 100-25 | 30 days supply | Qty: 30 | Fill #3

## 2017-08-16 MED FILL — AMLODIPINE BESYLATE 10 MG T: 10 | 30 days supply | Qty: 30 | Fill #3

## 2017-09-12 ENCOUNTER — Ambulatory Visit: Payer: Self-pay | Admitting: Nurse Practitioner

## 2017-09-12 MED FILL — AMLODIPINE BESYLATE 10 MG T: 10 | 30 days supply | Qty: 30 | Fill #4

## 2017-09-12 MED FILL — hydrOXYzine HCL 50 MG TABS: 50 | 30 days supply | Qty: 90 | Fill #1

## 2017-09-12 MED FILL — LOSARTAN-HCTZ 100-25 MG TAB: 100-25 | 30 days supply | Qty: 30 | Fill #4

## 2017-10-09 ENCOUNTER — Ambulatory Visit (INDEPENDENT_AMBULATORY_CARE_PROVIDER_SITE_OTHER): Payer: Self-pay

## 2017-10-09 DIAGNOSIS — Z1211 Encounter for screening for malignant neoplasm of colon: Secondary | ICD-10-CM

## 2017-10-09 MED ORDER — NA SULFATE-K SULFATE-MG SULF 17.5-3.13-1.6 GM/177ML PO SOLN: 1.0000 | ORAL | 0 refills | Status: DC

## 2017-10-09 NOTE — Progress Notes (Signed)
Gastroenterology Pre-Procedure Review  Request Date:10/09/17 Requesting Physician: Dr.Flemming- no previous tcs  PATIENT REVIEW QUESTIONS: The patient responded to the following health history questions as indicated:    1. Diabetes Melitis: no 2. Joint replacements in the past 12 months: no 3. Major health problems in the past 3 months: no 4. Has an artificial valve or MVP: no 5. Has a defibrillator: no 6. Has been advised in past to take antibiotics in advance of a procedure like teeth cleaning: no 7. Family history of colon cancer: no  8. Alcohol Use: no 9. History of sleep apnea: no  10. History of coronary artery or other vascular stents placed within the last 12 months: no 11. History of any prior anesthesia complications: no    MEDICATIONS & ALLERGIES:    Patient reports the following regarding taking any blood thinners:   Plavix? no Aspirin? no Coumadin? no Brilinta? no Xarelto? no Eliquis? no Pradaxa? no Savaysa? no Effient? no  Patient confirms/reports the following medications:  Current Outpatient Medications  Medication Sig Dispense Refill  . amLODipine (NORVASC) 10 MG tablet Take 1 tablet (10 mg total) by mouth daily. 90 tablet 3  . hydrOXYzine (ATARAX/VISTARIL) 50 MG tablet Take 1 tablet (50 mg total) by mouth 3 (three) times daily. 90 tablet 1  . losartan-hydrochlorothiazide (HYZAAR) 100-25 MG tablet Take 1 tablet by mouth daily. 90 tablet 3   No current facility-administered medications for this visit.     Patient confirms/reports the following allergies:  No Known Allergies  No orders of the defined types were placed in this encounter.   AUTHORIZATION INFORMATION Primary Insurance: pt has cone assistance exp 11/05/17   SCHEDULE INFORMATION: Procedure has been scheduled as follows:  Date: 10/30/17, Time:1:45 Location: [facility] Dr.Fields  This Gastroenterology Pre-Precedure Review Form is being routed to the following provider(s): AB

## 2017-10-09 NOTE — Patient Instructions (Signed)
Antonyo L Brumley  02/03/1966 MRN: [id number]     Procedure Date: 10/30/17 Time to register: 12:45pm Place to register: [facility] Procedure Time: 1:45pm Scheduled provider: Sandi Fields, MD    PREPARATION FOR COLONOSCOPY WITH SUPREP BOWEL PREP KIT  Note: Suprep Bowel Prep Kit is a split-dose (2day) regimen. Consumption of BOTH 6-ounce bottles is required for a complete prep.  Please notify us immediately if you are diabetic, take iron supplements, or if you are on Coumadin or any other blood thinners.                                                                                                                                                    1 DAY BEFORE PROCEDURE:  DATE: 10/29/17   DAY: Sunday  clear liquids the entire day - NO SOLID FOOD.     At 6:00pm: Complete steps 1 through 4 below, using ONE (1) 6-ounce bottle, before going to bed. Step 1:  Pour ONE (1) 6-ounce bottle of SUPREP liquid into the mixing container.  Step 2:  Add cool drinking water to the 16 ounce line on the container and mix.  Note: Dilute the solution concentrate as directed prior to use. Step 3:  DRINK ALL the liquid in the container. Step 4:  You MUST drink an additional two (2) or more 16 ounce containers of water over the next one (1) hour.   Continue clear liquids.  DAY OF PROCEDURE:   DATE: 10/30/17   DAY: Monday If you take medications for your heart, blood pressure, or breathing, you may take these medications.    5 hours before your procedure at :8:45am Step 1:  Pour ONE (1) 6-ounce bottle of SUPREP liquid into the mixing container.  Step 2:  Add cool drinking water to the 16 ounce line on the container and mix.  Note: Dilute the solution concentrate as directed prior to use. Step 3:  DRINK ALL the liquid in the container. Step 4:  You MUST drink an additional two (2) or more 16 ounce containers of water over the next one (1) hour. You MUST complete the final glass of water at least  3 hours before your colonoscopy.   Nothing by mouth past 10:45am   You may take your morning medications with sip of water unless we have instructed otherwise.    Please see below for Dietary Information.  CLEAR LIQUIDS INCLUDE:  Water Jello (NOT red in color)   Ice Popsicles (NOT red in color)   Tea (sugar ok, no milk/cream) Powdered fruit flavored drinks  Coffee (sugar ok, no milk/cream) Gatorade/ Lemonade/ Kool-Aid  (NOT red in color)   Juice: apple, white grape, white cranberry Soft drinks  Clear bullion, consomme, broth (fat free beef/chicken/vegetable)  Carbonated beverages (any kind)  Strained chicken noodle soup Hard Candy     Remember: Clear liquids are liquids that will allow you to see your fingers on the other side of a clear glass. Be sure liquids are NOT red in color, and not cloudy, but CLEAR.  DO NOT EAT OR DRINK ANY OF THE FOLLOWING:  Dairy products of any kind   Cranberry juice Tomato juice / V8 juice   Grapefruit juice Orange juice     Red grape juice  Do not eat any solid foods, including such foods as: cereal, oatmeal, yogurt, fruits, vegetables, creamed soups, eggs, bread, crackers, pureed foods in a blender, etc.   HELPFUL HINTS FOR DRINKING PREP SOLUTION:   Make sure prep is extremely cold. Mix and refrigerate the the morning of the prep. You may also put in the freezer.   You may try mixing some Crystal Light or Country Time Lemonade if you prefer. Mix in small amounts; add more if necessary.  Try drinking through a straw  Rinse mouth with water or a mouthwash between glasses, to remove after-taste.  Try sipping on a cold beverage /ice/ popsicles between glasses of prep.  Place a piece of sugar-free hard candy in mouth between glasses.  If you become nauseated, try consuming smaller amounts, or stretch out the time between glasses. Stop for 30-60 minutes, then slowly start back drinking.     OTHER INSTRUCTIONS  You will need a responsible adult  at least 54 years of age to accompany you and drive you home. This person must remain in the waiting room during your procedure. The hospital will cancel your procedure if you do not have a responsible adult with you.   1. Wear loose fitting clothing that is easily removed. 2. Leave jewelry and other valuables at home.  3. Remove all body piercing jewelry and leave at home. 4. Total time from sign-in until discharge is approximately 2-3 hours. 5. You should go home directly after your procedure and rest. You can resume normal activities the day after your procedure. 6. The day of your procedure you should not:  Drive  Make legal decisions  Operate machinery  Drink alcohol  Return to work   You may call the office (Dept: [phone]) before 5:00pm, or page the doctor on call ([phone]) after 5:00pm, for further instructions, if necessary.   Insurance Information YOU WILL NEED TO CHECK WITH YOUR INSURANCE COMPANY FOR THE BENEFITS OF COVERAGE YOU HAVE FOR THIS PROCEDURE.  UNFORTUNATELY, NOT ALL INSURANCE COMPANIES HAVE BENEFITS TO COVER ALL OR PART OF THESE TYPES OF PROCEDURES.  IT IS YOUR RESPONSIBILITY TO CHECK YOUR BENEFITS, HOWEVER, WE WILL BE GLAD TO ASSIST YOU WITH ANY CODES YOUR INSURANCE COMPANY MAY NEED.    PLEASE NOTE THAT MOST INSURANCE COMPANIES WILL NOT COVER A SCREENING COLONOSCOPY FOR PEOPLE UNDER THE AGE OF 50  IF YOU HAVE BCBS INSURANCE, YOU MAY HAVE BENEFITS FOR A SCREENING COLONOSCOPY BUT IF POLYPS ARE FOUND THE DIAGNOSIS WILL CHANGE AND THEN YOU MAY HAVE A DEDUCTIBLE THAT WILL NEED TO BE MET. SO PLEASE MAKE SURE YOU CHECK YOUR BENEFITS FOR A SCREENING COLONOSCOPY AS WELL AS A DIAGNOSTIC COLONOSCOPY.      

## 2017-10-10 NOTE — Progress Notes (Signed)
Appropriate.

## 2017-10-18 MED FILL — LOSARTAN-HCTZ 100-25 MG TAB: 100-25 | 30 days supply | Qty: 30 | Fill #5

## 2017-10-18 MED FILL — AMLODIPINE BESYLATE 10 MG T: 10 | 30 days supply | Qty: 30 | Fill #5

## 2017-10-25 ENCOUNTER — Telehealth: Payer: Self-pay

## 2017-10-25 NOTE — Telephone Encounter (Signed)
Tried to call pt to see if he could arrive earlier 10/30/17 for TCS d/t cancellations. Spoke to his friend Ishmael Holter(Telitha Fields-she is listed on DPR). She said he was going to call our office to cancel TCS and would call back later to reschedule. Advised her I would cancel TCS. LMOVM and informed endo scheduler.  Routing to [employer].

## 2017-10-25 NOTE — Telephone Encounter (Signed)
noted 

## 2017-10-30 ENCOUNTER — Ambulatory Visit (HOSPITAL_COMMUNITY): Admission: RE | Admit: 2017-10-30 | Payer: Self-pay | Source: Ambulatory Visit | Admitting: Gastroenterology

## 2017-10-30 ENCOUNTER — Encounter (HOSPITAL_COMMUNITY): Admission: RE | Payer: Self-pay | Source: Ambulatory Visit

## 2017-10-30 SURGERY — COLONOSCOPY
Anesthesia: Moderate Sedation

## 2017-11-10 ENCOUNTER — Encounter: Payer: Self-pay | Admitting: Nurse Practitioner

## 2017-11-10 ENCOUNTER — Ambulatory Visit: Payer: Self-pay | Attending: Nurse Practitioner | Admitting: Nurse Practitioner

## 2017-11-10 VITALS — BP 126/79 | HR 66 | Temp 98.5°F | Ht 71.0 in | Wt 233.4 lb

## 2017-11-10 DIAGNOSIS — F17210 Nicotine dependence, cigarettes, uncomplicated: Secondary | ICD-10-CM | POA: Insufficient documentation

## 2017-11-10 DIAGNOSIS — Z79899 Other long term (current) drug therapy: Secondary | ICD-10-CM | POA: Insufficient documentation

## 2017-11-10 DIAGNOSIS — F17200 Nicotine dependence, unspecified, uncomplicated: Secondary | ICD-10-CM

## 2017-11-10 DIAGNOSIS — F419 Anxiety disorder, unspecified: Secondary | ICD-10-CM | POA: Insufficient documentation

## 2017-11-10 DIAGNOSIS — I714 Abdominal aortic aneurysm, without rupture: Secondary | ICD-10-CM | POA: Insufficient documentation

## 2017-11-10 DIAGNOSIS — E785 Hyperlipidemia, unspecified: Secondary | ICD-10-CM | POA: Insufficient documentation

## 2017-11-10 DIAGNOSIS — I10 Essential (primary) hypertension: Secondary | ICD-10-CM | POA: Insufficient documentation

## 2017-11-10 MED ORDER — HYDROXYZINE HCL 25 MG PO TABS: 25.0000 mg | ORAL_TABLET | Freq: Three times a day (TID) | ORAL | 0 refills | Status: AC

## 2017-11-10 MED FILL — hydrOXYzine HCL 25 MG TABS: 25 | 30 days supply | Qty: 90 | Fill #0

## 2017-11-10 NOTE — Patient Instructions (Signed)
DASH Eating Plan DASH stands for "Dietary Approaches to Stop Hypertension." The DASH eating plan is a healthy eating plan that has been shown to reduce high blood pressure (hypertension). It may also reduce your risk for type 2 diabetes, heart disease, and stroke. The DASH eating plan may also help with weight loss. What are tips for following this plan? General guidelines  Avoid eating more than 2,300 mg (milligrams) of salt (sodium) a day. If you have hypertension, you may need to reduce your sodium intake to 1,500 mg a day.  Limit alcohol intake to no more than 1 drink a day for nonpregnant women and 2 drinks a day for men. One drink equals 12 oz of beer, 5 oz of wine, or 1 oz of hard liquor.  Work with your health care provider to maintain a healthy body weight or to lose weight. Ask what an ideal weight is for you.  Get at least 30 minutes of exercise that causes your heart to beat faster (aerobic exercise) most days of the week. Activities may include walking, swimming, or biking.  Work with your health care provider or diet and nutrition specialist (dietitian) to adjust your eating plan to your individual calorie needs. Reading food labels  Check food labels for the amount of sodium per serving. Choose foods with less than 5 percent of the Daily Value of sodium. Generally, foods with less than 300 mg of sodium per serving fit into this eating plan.  To find whole grains, look for the word "whole" as the first word in the ingredient list. Shopping  Buy products labeled as "low-sodium" or "no salt added."  Buy fresh foods. Avoid canned foods and premade or frozen meals. Cooking  Avoid adding salt when cooking. Use salt-free seasonings or herbs instead of table salt or sea salt. Check with your health care provider or pharmacist before using salt substitutes.  Do not fry foods. Cook foods using healthy methods such as baking, boiling, grilling, and broiling instead.  Cook with  heart-healthy oils, such as olive, canola, soybean, or sunflower oil. Meal planning   Eat a balanced diet that includes: ? 5 or more servings of fruits and vegetables each day. At each meal, try to fill half of your plate with fruits and vegetables. ? Up to 6-8 servings of whole grains each day. ? Less than 6 oz of lean meat, poultry, or fish each day. A 3-oz serving of meat is about the same size as a deck of cards. One egg equals 1 oz. ? 2 servings of low-fat dairy each day. ? A serving of nuts, seeds, or beans 5 times each week. ? Heart-healthy fats. Healthy fats called Omega-3 fatty acids are found in foods such as flaxseeds and coldwater fish, like sardines, salmon, and mackerel.  Limit how much you eat of the following: ? Canned or prepackaged foods. ? Food that is high in trans fat, such as fried foods. ? Food that is high in saturated fat, such as fatty meat. ? Sweets, desserts, sugary drinks, and other foods with added sugar. ? Full-fat dairy products.  Do not salt foods before eating.  Try to eat at least 2 vegetarian meals each week.  Eat more home-cooked food and less restaurant, buffet, and fast food.  When eating at a restaurant, ask that your food be prepared with less salt or no salt, if possible. What foods are recommended? The items listed may not be a complete list. Talk with your dietitian about what   dietary choices are best for you. Grains Whole-grain or whole-wheat bread. Whole-grain or whole-wheat pasta. Brown rice. Oatmeal. Quinoa. Bulgur. Whole-grain and low-sodium cereals. Pita bread. Low-fat, low-sodium crackers. Whole-wheat flour tortillas. Vegetables Fresh or frozen vegetables (raw, steamed, roasted, or grilled). Low-sodium or reduced-sodium tomato and vegetable juice. Low-sodium or reduced-sodium tomato sauce and tomato paste. Low-sodium or reduced-sodium canned vegetables. Fruits All fresh, dried, or frozen fruit. Canned fruit in natural juice (without  added sugar). Meat and other protein foods Skinless chicken or turkey. Ground chicken or turkey. Pork with fat trimmed off. Fish and seafood. Egg whites. Dried beans, peas, or lentils. Unsalted nuts, nut butters, and seeds. Unsalted canned beans. Lean cuts of beef with fat trimmed off. Low-sodium, lean deli meat. Dairy Low-fat (1%) or fat-free (skim) milk. Fat-free, low-fat, or reduced-fat cheeses. Nonfat, low-sodium ricotta or cottage cheese. Low-fat or nonfat yogurt. Low-fat, low-sodium cheese. Fats and oils Soft margarine without trans fats. Vegetable oil. Low-fat, reduced-fat, or light mayonnaise and salad dressings (reduced-sodium). Canola, safflower, olive, soybean, and sunflower oils. Avocado. Seasoning and other foods Herbs. Spices. Seasoning mixes without salt. Unsalted popcorn and pretzels. Fat-free sweets. What foods are not recommended? The items listed may not be a complete list. Talk with your dietitian about what dietary choices are best for you. Grains Baked goods made with fat, such as croissants, muffins, or some breads. Dry pasta or rice meal packs. Vegetables Creamed or fried vegetables. Vegetables in a cheese sauce. Regular canned vegetables (not low-sodium or reduced-sodium). Regular canned tomato sauce and paste (not low-sodium or reduced-sodium). Regular tomato and vegetable juice (not low-sodium or reduced-sodium). Pickles. Olives. Fruits Canned fruit in a light or heavy syrup. Fried fruit. Fruit in cream or butter sauce. Meat and other protein foods Fatty cuts of meat. Ribs. Fried meat. Bacon. Sausage. Bologna and other processed lunch meats. Salami. Fatback. Hotdogs. Bratwurst. Salted nuts and seeds. Canned beans with added salt. Canned or smoked fish. Whole eggs or egg yolks. Chicken or turkey with skin. Dairy Whole or 2% milk, cream, and half-and-half. Whole or full-fat cream cheese. Whole-fat or sweetened yogurt. Full-fat cheese. Nondairy creamers. Whipped toppings.  Processed cheese and cheese spreads. Fats and oils Butter. Stick margarine. Lard. Shortening. Ghee. Bacon fat. Tropical oils, such as coconut, palm kernel, or palm oil. Seasoning and other foods Salted popcorn and pretzels. Onion salt, garlic salt, seasoned salt, table salt, and sea salt. Worcestershire sauce. Tartar sauce. Barbecue sauce. Teriyaki sauce. Soy sauce, including reduced-sodium. Steak sauce. Canned and packaged gravies. Fish sauce. Oyster sauce. Cocktail sauce. Horseradish that you find on the shelf. Ketchup. Mustard. Meat flavorings and tenderizers. Bouillon cubes. Hot sauce and Tabasco sauce. Premade or packaged marinades. Premade or packaged taco seasonings. Relishes. Regular salad dressings. Where to find more information:  National Heart, Lung, and Blood Institute: www.nhlbi.nih.gov  American Heart Association: www.heart.org Summary  The DASH eating plan is a healthy eating plan that has been shown to reduce high blood pressure (hypertension). It may also reduce your risk for type 2 diabetes, heart disease, and stroke.  With the DASH eating plan, you should limit salt (sodium) intake to 2,300 mg a day. If you have hypertension, you may need to reduce your sodium intake to 1,500 mg a day.  When on the DASH eating plan, aim to eat more fresh fruits and vegetables, whole grains, lean proteins, low-fat dairy, and heart-healthy fats.  Work with your health care provider or diet and nutrition specialist (dietitian) to adjust your eating plan to your individual   calorie needs. This information is not intended to replace advice given to you by your health care provider. Make sure you discuss any questions you have with your health care provider. Document Released: 03/03/2011 Document Revised: 03/07/2016 Document Reviewed: 03/07/2016 Elsevier Interactive Patient Education  2018 [employer]  

## 2017-11-10 NOTE — Progress Notes (Signed)
Assessment & Plan:  Trueman was seen today for follow-up.  Diagnoses and all orders for this visit:  Essential hypertension, benign -     Basic metabolic panel -     CBC -     Lipid panel Continue all antihypertensives as prescribed.  Remember to bring in your blood pressure log with you for your follow up appointment.  DASH/Mediterranean Diets are healthier choices for HTN.    Anxiety -     hydrOXYzine (ATARAX/VISTARIL) 25 MG tablet; Take 1 tablet (25 mg total) by mouth 3 (three) times daily.  Tobacco dependence Tadd was counseled on the dangers of tobacco use, and was advised to quit. Reviewed strategies to maximize success, including removing cigarettes and smoking materials from environment, stress management and support of family/friends as well as pharmacological alternatives including: Wellbutrin, Chantix, Nicotine patch, Nicotine gum or lozenges. Smoking cessation support: smoking cessation hotline: 1-800-QUIT-NOW.  Smoking cessation classes are also available through [facility] and [facility]. Call 858-041-4672 or visit our website at [email or web address]   A total of 3 minutes was spent on counseling for smoking cessation and Wissam is not ready to quit.     Patient has been counseled on age-appropriate routine health concerns for screening and prevention. These are reviewed and up-to-date. Referrals have been placed accordingly. Immunizations are up-to-date or declined. He had to cancel his colonoscopy but reports he will be rescheduling soon.  Subjective:   Chief Complaint  Patient presents with  . Follow-up    Patient is fasting for lab work and follow-up on hypertension.    HPI CINQUE BEGLEY 52 y.o. male presents to office today for follow up HTN and Anxiety.  CHRONIC HYPERTENSION Disease Monitoring  Blood pressure range BP Readings from Last 3 Encounters:  11/10/17 126/79  08/09/17 139/86  06/14/17 119/77   Chest pain: no   Dyspnea: no     Claudication: no  Medication compliance: yes, taking Hyzaar 100-62m daily and amlodipine 156mdaily  Medication Side Effects  Lightheadedness: no   Urinary frequency: no   Edema: no   Impotence: no  Preventitive Healthcare:  Exercise: no   Diet Pattern: diet: general  Salt Restriction:  no   Anxiety Taking vistaril 5063mut endorses increased drowsiness. Will decrease from 50 mg to 25 mg. He does endorses significant relief of his anxiety symptoms.  GAD 7 : Generalized Anxiety Score 11/10/2017 04/13/2017  Nervous, Anxious, on Edge 1 2  Control/stop worrying 0 2  Worry too much - different things 0 2  Trouble relaxing 0 2  Restless 0 2  Easily annoyed or irritable 1 2  Afraid - awful might happen 0 2  Total GAD 7 Score 2 14     Review of Systems  Constitutional: Negative for fever, malaise/fatigue and weight loss.  HENT: Negative.  Negative for nosebleeds.   Eyes: Negative.  Negative for blurred vision, double vision and photophobia.  Respiratory: Negative.  Negative for cough and shortness of breath.   Cardiovascular: Negative.  Negative for chest pain, palpitations and leg swelling.  Gastrointestinal: Negative.  Negative for heartburn, nausea and vomiting.  Musculoskeletal: Negative.  Negative for myalgias.  Neurological: Negative.  Negative for dizziness, focal weakness, seizures and headaches.  Psychiatric/Behavioral: Negative.  Negative for suicidal ideas.    Past Medical History:  Diagnosis Date  . AAA (abdominal aortic [place]) [place] [facility] [place] [facility] dr gan[facility]inar Gip[facility] 10-29-2014   moderate distal aorta  w/ bilateral iliac artery involvement 2.45cm X 3.43cm/  involves bilateral iliac vessels 81m  . Heart murmur   . Hyperlipidemia   . Hypertension   . Incomplete right bundle branch block   . Left hydrocele     Past Surgical History:  Procedure Laterality Date  . CARDIOVASCULAR STRESS TEST  09-22-2014  dr gEinar Gip  normal perfusion study/  normal LV function and  wall motion , ef 62%  . HYDROCELE EXCISION Left 02/02/2015   Procedure: HYDROCELECTOMY ADULT;  Surgeon: BNickie Retort MD;  [facility]  Service: Urology;  Laterality: Left;  . TRANSTHORACIC ECHOCARDIOGRAM  10-29-2014   dr gEinar Gip  moderate concentric LVH, ef 60%/  mild AR, MR, and TR/  IVC borderline dilated w/ respiratory variation    Family History  Problem Relation Age of Onset  . Heart disease Father   . Heart disease Brother     Social History Reviewed with no changes to be made today.   Outpatient Medications Prior to Visit  Medication Sig Dispense Refill  . amLODipine (NORVASC) 10 MG tablet Take 1 tablet (10 mg total) by mouth daily. 90 tablet 3  . losartan-hydrochlorothiazide (HYZAAR) 100-25 MG tablet Take 1 tablet by mouth daily. 90 tablet 3  . hydrOXYzine (ATARAX/VISTARIL) 50 MG tablet Take 1 tablet (50 mg total) by mouth 3 (three) times daily. 90 tablet 1  . Na Sulfate-K Sulfate-Mg Sulf (SUPREP BOWEL PREP KIT) 17.5-3.13-1.6 GM/177ML SOLN Take 1 kit by mouth as directed. (Patient not taking: Reported on 11/10/2017) 1 Bottle 0   No facility-administered medications prior to visit.     No Known Allergies     Objective:    BP 126/79 (BP Location: Left Arm, Patient Position: Sitting, Cuff Size: Large)   Pulse 66   Temp 98.5 F (36.9 C) (Oral)   Ht _0  (1.803 m)   Wt 233 lb 6.4 oz (105.9 kg)   SpO2 96%   BMI 32.55 kg/m  Wt Readings from Last 3 Encounters:  11/10/17 233 lb 6.4 oz (105.9 kg)  08/09/17 233 lb 12.8 oz (106.1 kg)  06/14/17 231 lb 12.8 oz (105.1 kg)    Physical Exam  Constitutional: He is oriented to person, place, and time. He appears well-developed and well-nourished. He is cooperative.  HENT:  Head: Normocephalic and atraumatic.  Eyes: EOM are normal.  Neck: Normal range of motion.  Cardiovascular: Normal rate, regular rhythm, normal heart sounds and intact distal pulses. Exam reveals no gallop and no friction rub.    No murmur heard. Pulmonary/Chest: Effort normal and breath sounds normal. No tachypnea. No respiratory distress. He has no decreased breath sounds. He has no wheezes. He has no rhonchi. He has no rales. He exhibits no tenderness.  Abdominal: Bowel sounds are normal.  Musculoskeletal: Normal range of motion. He exhibits no edema.  Neurological: He is alert and oriented to person, place, and time. Coordination normal.  Skin: Skin is warm and dry.  Psychiatric: He has a normal mood and affect. His behavior is normal. Judgment and thought content normal.  Nursing note and vitals reviewed.        Patient has been counseled extensively about nutrition and exercise as well as the importance of adherence with medications and regular follow-up. The patient was given clear instructions to go to ER or return to medical center if symptoms don't improve, worsen or new problems develop. The patient verbalized understanding.   Follow-up: Return in about 3 months (around 02/10/2018) for Physical ONLY no labs.   Milina Pagett  Leanne Chang, FNP-BC [facility] and [place], [place]   11/10/2017, 9:57 AM

## 2017-11-11 LAB — LIPID PANEL
CHOL/HDL RATIO: 4.9 ratio (ref 0.0–5.0)
Cholesterol, Total: 161 mg/dL (ref 100–199)
HDL: 33 mg/dL — AB (ref >39)
LDL Calculated: 105 mg/dL — ABNORMAL HIGH (ref 0–99)
Triglycerides: 117 mg/dL (ref 0–149)
VLDL CHOLESTEROL CAL: 23 mg/dL (ref 5–40)

## 2017-11-11 LAB — CBC
HEMATOCRIT: 42.7 % (ref 37.5–51.0)
Hemoglobin: 14.2 g/dL (ref 13.0–17.7)
MCH: 29.3 pg (ref 26.6–33.0)
MCHC: 33.3 g/dL (ref 31.5–35.7)
MCV: 88 fL (ref 79–97)
Platelets: 305 10*3/uL (ref 150–450)
RBC: 4.85 x10E6/uL (ref 4.14–5.80)
RDW: 13.7 % (ref 12.3–15.4)
WBC: 9.7 10*3/uL (ref 3.4–10.8)

## 2017-11-11 LAB — BASIC METABOLIC PANEL
BUN/Creatinine Ratio: 12 (ref 9–20)
BUN: 12 mg/dL (ref 6–24)
CALCIUM: 9.7 mg/dL (ref 8.7–10.2)
CO2: 20 mmol/L (ref 20–29)
CREATININE: 1.04 mg/dL (ref 0.76–1.27)
Chloride: 102 mmol/L (ref 96–106)
GFR calc Af Amer: 94 mL/min/{1.73_m2} (ref >59)
GFR calc non Af Amer: 81 mL/min/{1.73_m2} (ref >59)
Glucose: 95 mg/dL (ref 65–99)
Potassium: 4.2 mmol/L (ref 3.5–5.2)
Sodium: 137 mmol/L (ref 134–144)

## 2017-11-13 MED FILL — AMLODIPINE BESYLATE 10 MG T: 10 | 30 days supply | Qty: 30 | Fill #6

## 2017-11-13 MED FILL — LOSARTAN-HCTZ 100-25 MG TAB: 100-25 | 30 days supply | Qty: 30 | Fill #6

## 2018-01-12 ENCOUNTER — Ambulatory Visit: Payer: Self-pay | Attending: Family Medicine

## 2018-01-15 MED FILL — AMLODIPINE BESYLATE 10 MG T: 10 | 30 days supply | Qty: 30 | Fill #7

## 2018-01-15 MED FILL — LOSARTAN-HCTZ 100-25 MG TAB: 100-25 | 30 days supply | Qty: 30 | Fill #7

## 2018-02-26 ENCOUNTER — Ambulatory Visit: Payer: Self-pay | Admitting: Nurse Practitioner

## 2018-02-27 ENCOUNTER — Ambulatory Visit: Payer: Self-pay

## 2018-02-27 MED FILL — AMLODIPINE BESYLATE 10 MG T: 10 | 30 days supply | Qty: 30 | Fill #8

## 2018-02-27 MED FILL — LOSARTAN-HCTZ 100-25 MG TAB: 100-25 | 30 days supply | Qty: 30 | Fill #8

## 2018-04-02 ENCOUNTER — Telehealth: Payer: Self-pay | Admitting: Nurse Practitioner

## 2018-04-02 MED FILL — AMLODIPINE BESYLATE 10 MG T: 10 | 30 days supply | Qty: 30 | Fill #9

## 2018-04-02 MED FILL — LOSARTAN-HCTZ 100-25 MG TAB: 100-25 | 30 days supply | Qty: 30 | Fill #9

## 2018-04-02 NOTE — Telephone Encounter (Signed)
Pt had refills available at pharmacy, refill was submitted and should be ready for pickup later today. No further action needed.

## 2018-04-02 NOTE — Telephone Encounter (Signed)
1) Medication(s) Requested (by name): -losartan-hydrochlorothiazide (HYZAAR) 100-25 MG tablet  -amLODipine (NORVASC) 10 MG tablet   2) Pharmacy of Choice: -[facility] [place] - [address] 3) Special Requests:   Approved medications will be sent to the pharmacy, we will reach out if there is an issue.  Requests made after 3pm may not be addressed until the following business day!  If a patient is unsure of the name of the medication(s) please note and ask patient to call back when they are able to provide all info, do not send to responsible party until all information is available!

## 2018-04-18 ENCOUNTER — Ambulatory Visit: Payer: Self-pay | Attending: Family Medicine

## 2018-04-27 ENCOUNTER — Ambulatory Visit: Payer: Self-pay | Admitting: Nurse Practitioner

## 2018-05-10 ENCOUNTER — Other Ambulatory Visit: Payer: Self-pay | Admitting: Physician Assistant

## 2018-05-10 DIAGNOSIS — I10 Essential (primary) hypertension: Secondary | ICD-10-CM

## 2018-05-10 MED FILL — LOSARTAN-HCTZ 100-25 MG TAB: 100-25 | 30 days supply | Qty: 30 | Fill #10

## 2018-05-15 ENCOUNTER — Other Ambulatory Visit: Payer: Self-pay | Admitting: Physician Assistant

## 2018-05-15 DIAGNOSIS — I10 Essential (primary) hypertension: Secondary | ICD-10-CM

## 2018-06-11 ENCOUNTER — Other Ambulatory Visit: Payer: Self-pay | Admitting: Nurse Practitioner

## 2018-06-11 DIAGNOSIS — F419 Anxiety disorder, unspecified: Secondary | ICD-10-CM

## 2018-07-13 ENCOUNTER — Encounter: Payer: Self-pay | Admitting: Nurse Practitioner

## 2018-07-13 ENCOUNTER — Ambulatory Visit: Payer: Self-pay | Attending: Nurse Practitioner | Admitting: Nurse Practitioner

## 2018-07-13 ENCOUNTER — Other Ambulatory Visit: Payer: Self-pay

## 2018-07-13 DIAGNOSIS — F17200 Nicotine dependence, unspecified, uncomplicated: Secondary | ICD-10-CM

## 2018-07-13 DIAGNOSIS — I10 Essential (primary) hypertension: Secondary | ICD-10-CM

## 2018-07-13 DIAGNOSIS — F17210 Nicotine dependence, cigarettes, uncomplicated: Secondary | ICD-10-CM

## 2018-07-13 MED ORDER — AMLODIPINE BESYLATE 10 MG PO TABS: 10.0000 mg | ORAL_TABLET | Freq: Every day | ORAL | 0 refills | Status: DC

## 2018-07-13 MED ORDER — LOSARTAN POTASSIUM-HCTZ 100-25 MG PO TABS: 1.0000 | ORAL_TABLET | Freq: Every day | ORAL | 0 refills | Status: DC

## 2018-07-13 MED FILL — LOSARTAN-HCTZ 100-25 MG TAB: 100-25 | 30 days supply | Qty: 30 | Fill #0

## 2018-07-13 MED FILL — AMLODIPINE BESYLATE 10 MG T: 10 | 30 days supply | Qty: 30 | Fill #0

## 2018-07-13 NOTE — Progress Notes (Signed)
Virtual Visit via Telephone Note  I connected with Randall Ferrell on 07/13/18 at  3:05 PM EDT by telephone and verified that I am speaking with the correct person using two identifiers.   I discussed the limitations, risks, security and privacy concerns of performing an evaluation and management service by telephone and the availability of in person appointments. I also discussed with the patient that there may be a patient responsible charge related to this service. The patient expressed understanding and agreed to proceed.   History of Present Illness: Telemedicine visit today for HTN. He has unfortunately been out of his blood pressure medications for over a month. Current medications include amlodipine 10mg  and losartan-hctz 100-25 mg daily. He has been monitoring his blood pressure sporadically with systolic readings as high as 160s since he has been out of his medications. He also endorses drinking some alcohol and continues to smoke. We discussed dietary modifications today. He avoids excessive sodium in his diet. Does not have an exercise routine but stays active.  BP Readings from Last 3 Encounters:  11/10/17 126/79  08/09/17 139/86  06/14/17 119/77    Observations/Objective: Alert and oriented x3.   Assessment and Plan: Randall Ferrell was seen today for follow-up.  Diagnoses and all orders for this visit:  Essential hypertension -     losartan-hydrochlorothiazide (HYZAAR) 100-25 MG tablet; Take 1 tablet by mouth daily. -     amLODipine (NORVASC) 10 MG tablet; Take 1 tablet (10 mg total) by mouth daily. Continue all antihypertensives as prescribed.  Remember to bring in your blood pressure log with you for your follow up appointment.  DASH/Mediterranean Diets are healthier choices for HTN.   Tobacco Dependence Randall Ferrell was counseled on the dangers of tobacco use, and was advised to quit. Reviewed strategies to maximize success, including removing cigarettes and smoking materials from  environment, stress management and support of family/friends as well as pharmacological alternatives including: Wellbutrin, Chantix, Nicotine patch, Nicotine gum or lozenges. Smoking cessation support: smoking cessation hotline: 1-800-QUIT-NOW.  Smoking cessation classes are also available through [facility] and [facility]. Call (434)235-1851 or visit our website at [email or web address].   A total of 3 minutes was spent on counseling for smoking cessation and Randall Ferrell is not ready to quit.    Follow Up Instructions: Return in about 3 months (around 10/12/2018).   I discussed the assessment and treatment plan with the patient. The patient was provided an opportunity to ask questions and all were answered. The patient agreed with the plan and demonstrated an understanding of the instructions.   The patient was advised to call back or seek an in-person evaluation if the symptoms worsen or if the condition fails to improve as anticipated.  I provided 12 minutes of non-face-to-face time during this encounter.   Claiborne Rigg, NP

## 2018-08-15 MED FILL — AMLODIPINE BESYLATE 10 MG T: 10 | 30 days supply | Qty: 30 | Fill #1

## 2018-08-15 MED FILL — LOSARTAN-HCTZ 100-25 MG TAB: 100-25 | 30 days supply | Qty: 30 | Fill #1

## 2018-09-14 MED FILL — AMLODIPINE BESYLATE 10 MG T: 10 | 30 days supply | Qty: 30 | Fill #2

## 2018-09-14 MED FILL — LOSARTAN-HCTZ 100-25 MG TAB: 100-25 | 30 days supply | Qty: 30 | Fill #2

## 2018-11-05 ENCOUNTER — Other Ambulatory Visit: Payer: Self-pay | Admitting: Nurse Practitioner

## 2018-11-05 DIAGNOSIS — I10 Essential (primary) hypertension: Secondary | ICD-10-CM

## 2018-11-05 MED ORDER — LOSARTAN POTASSIUM-HCTZ 100-25 MG PO TABS: 1.0000 | ORAL_TABLET | Freq: Every day | ORAL | 0 refills | Status: DC

## 2018-11-05 MED ORDER — AMLODIPINE BESYLATE 10 MG PO TABS: 10.0000 mg | ORAL_TABLET | Freq: Every day | ORAL | 0 refills | Status: DC

## 2018-11-05 NOTE — Telephone Encounter (Signed)
New Message   1) Medication(s) Requested (by name): blood pressure medication pt not sure of name  2) Pharmacy of Choice: [facility]  3) Special Requests: Pt was schedule for Friday 11/09/2018 but appt had to be cancelled and rescheduled due to provider being out of the office. Pt would like a refill up until 9/1 when his new appt is scheduled   Approved medications will be sent to the pharmacy, we will reach out if there is an issue.  Requests made after 3pm may not be addressed until the following business day!  If a patient is unsure of the name of the medication(s) please note and ask patient to call back when they are able to provide all info, do not send to responsible party until all information is available!

## 2018-11-16 ENCOUNTER — Ambulatory Visit: Payer: Self-pay | Admitting: Family Medicine

## 2018-11-27 ENCOUNTER — Other Ambulatory Visit: Payer: Self-pay

## 2018-11-27 ENCOUNTER — Ambulatory Visit: Payer: Self-pay | Attending: Family Medicine | Admitting: Family Medicine

## 2018-11-27 DIAGNOSIS — F419 Anxiety disorder, unspecified: Secondary | ICD-10-CM

## 2018-11-27 DIAGNOSIS — I10 Essential (primary) hypertension: Secondary | ICD-10-CM

## 2018-11-27 MED ORDER — AMLODIPINE BESYLATE 10 MG PO TABS: 10.0000 mg | ORAL_TABLET | Freq: Every day | ORAL | 3 refills | Status: DC

## 2018-11-27 MED ORDER — HYDROXYZINE HCL 25 MG PO TABS: 25.0000 mg | ORAL_TABLET | Freq: Three times a day (TID) | ORAL | 2 refills | Status: DC | PRN

## 2018-11-27 MED ORDER — LOSARTAN POTASSIUM-HCTZ 100-25 MG PO TABS: 1.0000 | ORAL_TABLET | Freq: Every day | ORAL | 3 refills | Status: DC

## 2018-11-27 NOTE — Progress Notes (Signed)
Virtual Visit via Telephone Note  I connected with Randall Ferrell, on 11/27/2018 at 4:32 PM by telephone due to the COVID-19 pandemic and verified that I am speaking with the correct person using two identifiers.   Consent: I discussed the limitations, risks, security and privacy concerns of performing an evaluation and management service by telephone and the availability of in person appointments. I also discussed with the patient that there may be a patient responsible charge related to this service. The patient expressed understanding and agreed to proceed.   Location of Patient: Environmental education officer of Provider: Clinic   Persons participating in Telemedicine visit: Alonza Bogus Farrington-CMA Dr. Felecia Shelling     History of Present Illness: 53 year old male patient of Randall Ferrell who is seen today with a history of hypertension and is requesting refills of his chronic medications.  Last office visit with PCP was in 06/2018. He is requesting refill of hydroxyzine which she was prescribed for anxiety and uses this as needed. Denies chest pain, pedal edema or additional concerns today.   Past Medical History:  Diagnosis Date  . AAA (abdominal aortic aneurysm) ([place]) per duplex at dr Einar Gip office 10-29-2014   moderate distal aorta  w/ bilateral iliac artery involvement 2.45cm X 3.43cm/  involves bilateral iliac vessels 75mm  . Heart murmur   . Hyperlipidemia   . Hypertension   . Incomplete right bundle branch block   . Left hydrocele    No Known Allergies  Current Outpatient Medications on File Prior to Visit  Medication Sig Dispense Refill  . amLODipine (NORVASC) 10 MG tablet Take 1 tablet (10 mg total) by mouth daily. 30 tablet 0  . losartan-hydrochlorothiazide (HYZAAR) 100-25 MG tablet Take 1 tablet by mouth daily. 30 tablet 0   No current facility-administered medications on file prior to visit.     Observations/Objective: Alert, awake, oriented x3 Not in acute  distress  Assessment and Plan: 1. Essential hypertension Stable Counseled on blood pressure goal of less than 130/80, low-sodium, DASH diet, medication compliance, 150 minutes of moderate intensity exercise per week. Discussed medication compliance, adverse effects. - amLODipine (NORVASC) 10 MG tablet; Take 1 tablet (10 mg total) by mouth daily.  Dispense: 30 tablet; Refill: 3 - losartan-hydrochlorothiazide (HYZAAR) 100-25 MG tablet; Take 1 tablet by mouth daily.  Dispense: 30 tablet; Refill: 3  2. Anxiety Stable - hydrOXYzine (ATARAX/VISTARIL) 25 MG tablet; Take 1 tablet (25 mg total) by mouth 3 (three) times daily as needed.  Dispense: 60 tablet; Refill: 2   Follow Up Instructions: Return in about 3 months (around 02/26/2019) for PCP- [name]. Medical conditions.    I discussed the assessment and treatment plan with the patient. The patient was provided an opportunity to ask questions and all were answered. The patient agreed with the plan and demonstrated an understanding of the instructions.   The patient was advised to call back or seek an in-person evaluation if the symptoms worsen or if the condition fails to improve as anticipated.     I provided 7 minutes total of non-face-to-face time during this encounter including median intraservice time, reviewing previous notes, labs, imaging, medications, management and patient verbalized understanding.     Charlott Rakes, MD, FAAFP. [facility] and [place], [place]   11/27/2018, 4:32 PM

## 2018-11-27 NOTE — Progress Notes (Signed)
Patient has been called and DOB has been verified. Patient has been screened and transferred to PCP to start phone visit.    Patient needs refill on BP medications.

## 2018-11-28 MED FILL — hydrOXYzine HCL 25 MG TABS: 25 | 20 days supply | Qty: 60 | Fill #0

## 2018-11-28 MED FILL — LOSARTAN-HCTZ 100-25 MG TAB: 100-25 | 30 days supply | Qty: 30 | Fill #0

## 2018-11-28 MED FILL — ?AMLODIPINE BESYLATE 10 MG: 10 | 30 days supply | Qty: 30 | Fill #0

## 2018-12-24 MED FILL — hydrOXYzine HCL 25 MG TABS: 25 | 20 days supply | Qty: 60 | Fill #1

## 2018-12-24 MED FILL — LOSARTAN-HCTZ 100-25 MG TAB: 100-25 | 30 days supply | Qty: 30 | Fill #1

## 2018-12-24 MED FILL — ?AMLODIPINE BESYLATE 10 MG: 10 | 30 days supply | Qty: 30 | Fill #1

## 2019-01-28 MED FILL — hydrOXYzine HCL 25 MG TABS: 25 | 20 days supply | Qty: 60 | Fill #2

## 2019-01-28 MED FILL — LOSARTAN-HCTZ 100-25 MG TAB: 100-25 | 30 days supply | Qty: 30 | Fill #2

## 2019-01-28 MED FILL — ?AMLODIPINE BESYLATE 10 MG: 10 | 30 days supply | Qty: 30 | Fill #2

## 2019-03-08 ENCOUNTER — Other Ambulatory Visit: Payer: Self-pay | Admitting: Family Medicine

## 2019-03-08 DIAGNOSIS — F419 Anxiety disorder, unspecified: Secondary | ICD-10-CM

## 2019-03-08 MED FILL — hydrOXYzine HCL 25 MG TABS: 25 | 20 days supply | Qty: 60 | Fill #0

## 2019-03-08 MED FILL — LOSARTAN-HCTZ 100-25 MG TAB: 100-25 | 30 days supply | Qty: 30 | Fill #3

## 2019-03-08 MED FILL — ?AMLODIPINE BESYLATE 10 MG: 10 | 30 days supply | Qty: 30 | Fill #3

## 2019-04-23 MED FILL — LOSARTAN-HCTZ 100-25 MG TAB: 100-25 | 30 days supply | Qty: 30 | Fill #0

## 2019-04-23 MED FILL — AMLODIPINE BESYLATE 10 MG T: 10 | 30 days supply | Qty: 30 | Fill #0

## 2019-04-23 MED FILL — hydrOXYzine HCL 25 MG TABS: 25 | 20 days supply | Qty: 60 | Fill #1

## 2019-05-29 ENCOUNTER — Other Ambulatory Visit: Payer: Self-pay | Admitting: Nurse Practitioner

## 2019-05-29 DIAGNOSIS — I10 Essential (primary) hypertension: Secondary | ICD-10-CM

## 2019-05-29 MED FILL — LOSARTAN-HCTZ 100-25 MG TAB: 100-25 | 30 days supply | Qty: 30 | Fill #0

## 2019-05-29 MED FILL — AMLODIPINE BESYLATE 10 MG T: 10 | 30 days supply | Qty: 30 | Fill #0

## 2019-05-29 MED FILL — hydrOXYzine HCL 25 MG TABS: 25 | 20 days supply | Qty: 60 | Fill #2

## 2019-07-02 ENCOUNTER — Other Ambulatory Visit: Payer: Self-pay | Admitting: Family Medicine

## 2019-07-02 DIAGNOSIS — F419 Anxiety disorder, unspecified: Secondary | ICD-10-CM

## 2019-07-02 DIAGNOSIS — I10 Essential (primary) hypertension: Secondary | ICD-10-CM

## 2019-07-09 ENCOUNTER — Ambulatory Visit: Payer: Self-pay | Attending: Nurse Practitioner | Admitting: Nurse Practitioner

## 2019-07-09 ENCOUNTER — Other Ambulatory Visit: Payer: Self-pay | Admitting: Family Medicine

## 2019-07-09 ENCOUNTER — Other Ambulatory Visit: Payer: Self-pay | Admitting: Nurse Practitioner

## 2019-07-09 ENCOUNTER — Encounter: Payer: Self-pay | Admitting: Nurse Practitioner

## 2019-07-09 ENCOUNTER — Other Ambulatory Visit: Payer: Self-pay

## 2019-07-09 VITALS — BP 151/80 | HR 58 | Temp 97.9°F | Ht 71.0 in | Wt 226.0 lb

## 2019-07-09 DIAGNOSIS — I10 Essential (primary) hypertension: Secondary | ICD-10-CM

## 2019-07-09 DIAGNOSIS — Z114 Encounter for screening for human immunodeficiency virus [HIV]: Secondary | ICD-10-CM

## 2019-07-09 DIAGNOSIS — Z8679 Personal history of other diseases of the circulatory system: Secondary | ICD-10-CM

## 2019-07-09 DIAGNOSIS — Z131 Encounter for screening for diabetes mellitus: Secondary | ICD-10-CM

## 2019-07-09 DIAGNOSIS — F17200 Nicotine dependence, unspecified, uncomplicated: Secondary | ICD-10-CM

## 2019-07-09 DIAGNOSIS — Z130 Encounter for screening for diseases of the blood and blood-forming organs and certain disorders involving the immune mechanism: Secondary | ICD-10-CM

## 2019-07-09 DIAGNOSIS — B354 Tinea corporis: Secondary | ICD-10-CM

## 2019-07-09 DIAGNOSIS — F419 Anxiety disorder, unspecified: Secondary | ICD-10-CM

## 2019-07-09 DIAGNOSIS — E785 Hyperlipidemia, unspecified: Secondary | ICD-10-CM

## 2019-07-09 DIAGNOSIS — Z1211 Encounter for screening for malignant neoplasm of colon: Secondary | ICD-10-CM

## 2019-07-09 MED ORDER — TERBINAFINE HCL 1 % EX CREA: 1.0000 "application " | TOPICAL_CREAM | Freq: Two times a day (BID) | CUTANEOUS | 0 refills | Status: DC

## 2019-07-09 MED ORDER — HYDROXYZINE HCL 50 MG PO TABS: 50.0000 mg | ORAL_TABLET | Freq: Three times a day (TID) | ORAL | 1 refills | Status: DC | PRN

## 2019-07-09 MED ORDER — LOSARTAN POTASSIUM-HCTZ 100-25 MG PO TABS: 1.0000 | ORAL_TABLET | Freq: Every day | ORAL | 1 refills | Status: DC

## 2019-07-09 MED ORDER — AMLODIPINE BESYLATE 10 MG PO TABS: 10.0000 mg | ORAL_TABLET | Freq: Every day | ORAL | 0 refills | Status: DC

## 2019-07-09 MED FILL — LOSARTAN-HCTZ 100-25 MG TAB: 100-25 | 30 days supply | Qty: 30 | Fill #0

## 2019-07-09 MED FILL — hydrOXYzine HCL 50 MG TABS: 50 | 30 days supply | Qty: 90 | Fill #0

## 2019-07-09 NOTE — Progress Notes (Signed)
Assessment & Plan:  [name] was seen today for follow-up.  Diagnoses and all orders for this visit:  Essential hypertension -     CMP14+EGFR -     losartan-hydrochlorothiazide (HYZAAR) 100-25 MG tablet; Take 1 tablet by mouth daily. -     amLODipine (NORVASC) 10 MG tablet; Take 1 tablet (10 mg total) by mouth daily. Continue all antihypertensives as prescribed.  Remember to bring in your blood pressure log with you for your follow up appointment.  DASH/Mediterranean Diets are healthier choices for HTN.    Anxiety -     hydrOXYzine (ATARAX/VISTARIL) 50 MG tablet; Take 1 tablet (50 mg total) by mouth every 8 (eight) hours as needed for anxiety.  Screening for HIV (human immunodeficiency virus) -     HIV antibody (with reflex)  Tobacco dependence [name] was counseled on the dangers of tobacco use, and was advised to quit. Reviewed strategies to maximize success, including removing cigarettes and smoking materials from environment, stress management and support of family/friends as well as pharmacological alternatives including: Wellbutrin, Chantix, Nicotine patch, Nicotine gum or lozenges. Smoking cessation support: smoking cessation hotline: 1-800-QUIT-NOW.  Smoking cessation classes are also available through [facility] and [facility]. Call 220-099-0527 or visit our website at [email or web address]   A total of 3 minutes was spent on counseling for smoking cessation and [name] is not ready to quit.   Colon cancer screening -     Fecal occult blood, imunochemical([facility]/Sun[facility])  Screening for deficiency anemia -     CBC  Encounter for screening for diabetes mellitus -     Hemoglobin A1c  Dyslipidemia, goal LDL below 70 -     Lipid panel INSTRUCTIONS: Work on a low fat, heart healthy diet and participate in regular aerobic exercise program by working out at least 150 minutes per week; 5 days a week-30 minutes per day. Avoid red meat/beef/steak,  fried foods. junk  foods, sodas, sugary drinks, unhealthy snacking, alcohol and smoking.  Drink at least 80 oz of water per day and monitor your carbohydrate intake daily.    History of abdominal aortic aneurysm (AAA) -     US Abdomen Complete; Future  Ringworm -     terbinafine (LAMISIL AT) 1 % cream; Apply 1 application topically 2 (two) times daily.    Patient has been counseled on age-appropriate routine health concerns for screening and prevention. These are reviewed and up-to-date. Referrals have been placed accordingly. Immunizations are up-to-date or declined.    Subjective:   Chief Complaint  Patient presents with  . Follow-up    Pt. is here for hypertension follow up and medication refill.    HPI                                                                       Randall Ferrell 54 y.o. male presents to office today for follow up.  Essential Hypertension He ran out of his blood pressure medication. He has not been seen in this office in almost a year. Current medication: losartan-HCTZ 100-25 mg. Denies chest pain, shortness of breath, palpitations, lightheadedness, dizziness, headaches or BLE edema. Blood pressure is elevated today. He does not monitor his blood pressure at home.  BP Readings from Last  3 Encounters:  07/09/19 (!) 151/80  11/10/17 126/79  08/09/17 139/86   Anxiety Would like to increase his hydroxyzine from 25 mg to 50 mg TID. Reports increased symptoms of anxiety. Mother recently passed. Declines SSRI today.   Review of Systems  Constitutional: Negative for fever, malaise/fatigue and weight loss.  HENT: Negative.  Negative for nosebleeds.   Eyes: Negative.  Negative for blurred vision, double vision and photophobia.  Respiratory: Negative.  Negative for cough and shortness of breath.   Cardiovascular: Negative.  Negative for chest pain, palpitations and leg swelling.  Gastrointestinal: Negative.  Negative for heartburn, nausea and vomiting.  Musculoskeletal: Negative.   Negative for myalgias.  Skin: Positive for itching and rash.  Neurological: Negative.  Negative for dizziness, focal weakness, seizures and headaches.  Psychiatric/Behavioral: Negative for suicidal ideas. The patient is nervous/anxious.     Past Medical History:  Diagnosis Date  . AAA (abdominal aortic aneurysm) ([place]) per duplex at dr Einar Gip office 10-29-2014   moderate distal aorta  w/ bilateral iliac artery involvement 2.45cm X 3.43cm/  involves bilateral iliac vessels 19m  . Heart murmur   . Hyperlipidemia   . Hypertension   . Incomplete right bundle branch block   . Left hydrocele     Past Surgical History:  Procedure Laterality Date  . CARDIOVASCULAR STRESS TEST  09-22-2014  dr gEinar Gip  normal perfusion study/  normal LV function and wall motion , ef 62%  . HYDROCELE EXCISION Left 02/02/2015   Procedure: HYDROCELECTOMY ADULT;  Surgeon: BNickie Retort MD;  [facility]  Service: Urology;  Laterality: Left;  . TRANSTHORACIC ECHOCARDIOGRAM  10-29-2014   dr gEinar Gip  moderate concentric LVH, ef 60%/  mild AR, MR, and TR/  IVC borderline dilated w/ respiratory variation    Family History  Problem Relation Age of Onset  . Heart disease Father   . Heart disease Brother     Social History Reviewed with no changes to be made today.   Outpatient Medications Prior to Visit  Medication Sig Dispense Refill  . amLODipine (NORVASC) 10 MG tablet Take 1 tablet (10 mg total) by mouth daily. Must have office visit for refills 30 tablet 0  . hydrOXYzine (ATARAX/VISTARIL) 25 MG tablet TAKE 1 TABLET (25 MG TOTAL) BY MOUTH 3 (THREE) TIMES DAILY AS NEEDED. 60 tablet 2  . losartan-hydrochlorothiazide (HYZAAR) 100-25 MG tablet Take 1 tablet by mouth daily. Must have office visit for refills 30 tablet 0   No facility-administered medications prior to visit.    No Known Allergies     Objective:    BP (!) 151/80 (BP Location: Right Arm, Patient Position: Sitting,  Cuff Size: Normal)   Pulse (!) 58   Temp 97.9 F (36.6 C) (Temporal)   Ht 5' 11" (1.803 m)   Wt 226 lb (102.5 kg)   SpO2 98%   BMI 31.52 kg/m  Wt Readings from Last 3 Encounters:  07/09/19 226 lb (102.5 kg)  11/10/17 233 lb 6.4 oz (105.9 kg)  08/09/17 233 lb 12.8 oz (106.1 kg)    Physical Exam Vitals and nursing note reviewed.  Constitutional:      Appearance: He is well-developed.  HENT:     Head: Normocephalic and atraumatic.  Cardiovascular:     Rate and Rhythm: Regular rhythm. Bradycardia present.     Heart sounds: Normal heart sounds. No murmur. No friction rub. No gallop.   Pulmonary:     Effort: Pulmonary effort is normal.  No tachypnea or respiratory distress.     Breath sounds: Normal breath sounds. No decreased breath sounds, wheezing, rhonchi or rales.  Chest:     Chest wall: No tenderness.  Abdominal:     General: Bowel sounds are normal.     Palpations: Abdomen is soft.  Musculoskeletal:        General: Normal range of motion.     Cervical back: Normal range of motion.       Legs:  Skin:    General: Skin is warm and dry.  Neurological:     Mental Status: He is alert and oriented to person, place, and time.     Coordination: Coordination normal.  Psychiatric:        Behavior: Behavior normal. Behavior is cooperative.        Thought Content: Thought content normal.        Judgment: Judgment normal.          Patient has been counseled extensively about nutrition and exercise as well as the importance of adherence with medications and regular follow-up. The patient was given clear instructions to go to ER or return to medical center if symptoms don't improve, worsen or new problems develop. The patient verbalized understanding.   Follow-up: Return in about 3 months (around 10/08/2019) for HTN.   Gildardo Pounds, FNP-BC [facility] and [facility] [place], [place]   07/09/2019, 11:11 PM

## 2019-07-10 LAB — LIPID PANEL
Chol/HDL Ratio: 4.6 ratio (ref 0.0–5.0)
Cholesterol, Total: 161 mg/dL (ref 100–199)
HDL: 35 mg/dL — ABNORMAL LOW (ref >39)
LDL Chol Calc (NIH): 102 mg/dL — ABNORMAL HIGH (ref 0–99)
Triglycerides: 134 mg/dL (ref 0–149)
VLDL Cholesterol Cal: 24 mg/dL (ref 5–40)

## 2019-07-10 LAB — CBC
Hematocrit: 44.2 % (ref 37.5–51.0)
Hemoglobin: 15 g/dL (ref 13.0–17.7)
MCH: 29.1 pg (ref 26.6–33.0)
MCHC: 33.9 g/dL (ref 31.5–35.7)
MCV: 86 fL (ref 79–97)
Platelets: 246 10*3/uL (ref 150–450)
RBC: 5.15 x10E6/uL (ref 4.14–5.80)
RDW: 13.2 % (ref 11.6–15.4)
WBC: 8.2 10*3/uL (ref 3.4–10.8)

## 2019-07-10 LAB — CMP14+EGFR
ALT: 22 IU/L (ref 0–44)
AST: 16 IU/L (ref 0–40)
Albumin/Globulin Ratio: 1.4 (ref 1.2–2.2)
Albumin: 4.4 g/dL (ref 3.8–4.9)
Alkaline Phosphatase: 87 IU/L (ref 39–117)
BUN/Creatinine Ratio: 14 (ref 9–20)
BUN: 15 mg/dL (ref 6–24)
Bilirubin Total: 0.7 mg/dL (ref 0.0–1.2)
CO2: 20 mmol/L (ref 20–29)
Calcium: 9.9 mg/dL (ref 8.7–10.2)
Chloride: 108 mmol/L — ABNORMAL HIGH (ref 96–106)
Creatinine, Ser: 1.06 mg/dL (ref 0.76–1.27)
GFR calc Af Amer: 91 mL/min/{1.73_m2} (ref >59)
GFR calc non Af Amer: 79 mL/min/{1.73_m2} (ref >59)
Globulin, Total: 3.2 g/dL (ref 1.5–4.5)
Glucose: 93 mg/dL (ref 65–99)
Potassium: 4.6 mmol/L (ref 3.5–5.2)
Sodium: 141 mmol/L (ref 134–144)
Total Protein: 7.6 g/dL (ref 6.0–8.5)

## 2019-07-10 LAB — HIV ANTIBODY (ROUTINE TESTING W REFLEX): HIV Screen 4th Generation wRfx: NONREACTIVE

## 2019-07-10 LAB — HEMOGLOBIN A1C
Est. average glucose Bld gHb Est-mCnc: 123 mg/dL
Hgb A1c MFr Bld: 5.9 % — ABNORMAL HIGH (ref 4.8–5.6)

## 2019-07-10 MED FILL — AMLODIPINE BESYLATE 10 MG T: 10 | 30 days supply | Qty: 30 | Fill #0

## 2019-08-09 LAB — FECAL OCCULT BLOOD, IMMUNOCHEMICAL: Fecal Occult Bld: NEGATIVE

## 2019-09-06 ENCOUNTER — Other Ambulatory Visit: Payer: Self-pay | Admitting: Nurse Practitioner

## 2019-09-06 DIAGNOSIS — F419 Anxiety disorder, unspecified: Secondary | ICD-10-CM

## 2019-09-09 MED FILL — hydrOXYzine HCL 50 MG TABS: 50 | 30 days supply | Qty: 90 | Fill #0

## 2019-10-08 ENCOUNTER — Encounter: Payer: Self-pay | Admitting: Nurse Practitioner

## 2019-10-08 ENCOUNTER — Ambulatory Visit: Payer: Self-pay | Attending: Nurse Practitioner | Admitting: Nurse Practitioner

## 2019-10-08 ENCOUNTER — Other Ambulatory Visit: Payer: Self-pay | Admitting: Nurse Practitioner

## 2019-10-08 ENCOUNTER — Other Ambulatory Visit: Payer: Self-pay

## 2019-10-08 DIAGNOSIS — I10 Essential (primary) hypertension: Secondary | ICD-10-CM

## 2019-10-08 DIAGNOSIS — M2550 Pain in unspecified joint: Secondary | ICD-10-CM

## 2019-10-08 MED ORDER — AMLODIPINE BESYLATE 10 MG PO TABS: 10.0000 mg | ORAL_TABLET | Freq: Every day | ORAL | 1 refills | Status: DC

## 2019-10-08 MED ORDER — GABAPENTIN 100 MG PO CAPS: 100.0000 mg | ORAL_CAPSULE | Freq: Three times a day (TID) | ORAL | 1 refills | Status: DC

## 2019-10-08 MED ORDER — LOSARTAN POTASSIUM-HCTZ 100-25 MG PO TABS: 1.0000 | ORAL_TABLET | Freq: Every day | ORAL | 1 refills | Status: DC

## 2019-10-08 NOTE — Progress Notes (Signed)
Virtual Visit via Telephone Note Due to national recommendations of social distancing due to COVID 19, telehealth visit is felt to be most appropriate for this patient at this time.  I discussed the limitations, risks, security and privacy concerns of performing an evaluation and management service by telephone and the availability of in person appointments. I also discussed with the patient that there may be a patient responsible charge related to this service. The patient expressed understanding and agreed to proceed.    I connected with [name] on 10/08/19  at   3:30 PM EDT  EDT by telephone and verified that I am speaking with the correct person using two identifiers.   Consent I discussed the limitations, risks, security and privacy concerns of performing an evaluation and management service by telephone and the availability of in person appointments. I also discussed with the patient that there may be a patient responsible charge related to this service. The patient expressed understanding and agreed to proceed.   Location of Patient: Private Residence   [facility]    Persons participating in Telemedicine visit: Bertram Denver FNP-BC YY [place] CMA [name]    History of Present Illness: Telemedicine visit for: Follow up  has a past medical history of AAA (abdominal aortic aneurysm) (HCC) (per duplex at dr Jacinto Halim office 10-29-2014), Heart murmur, Hyperlipidemia, Hypertension, Incomplete right bundle branch block, and Left hydrocele.  Essential Hypertension Currently taking amlodipine 10 mg and hyzaar 100-25 mg daily as prescribed. Denies chest pain, shortness of breath, palpitations, lightheadedness, dizziness, headaches or BLE edema. Has not been monitoring blood pressure at home as he does not have a device.  BP Readings from Last 3 Encounters:  07/09/19 (!) 151/80  11/10/17 126/79  08/09/17 139/86    Joint  Pain: Patient complains of arthralgias for which has been present for several weeks. Pain is located in bilateral hips and knees, is described as soreness, aching and sharp, and is intermittent . Worse upon awakening in the mornings.   Associated symptoms include: none.  The patient has tried NSAIDs for pain, with transient relief.  Related to injury:no.   Past Medical History:  Diagnosis Date   AAA (abdominal aortic aneurysm) (HCC) per duplex at dr Jacinto Halim office 10-29-2014   moderate distal aorta  w/ bilateral iliac artery involvement 2.45cm X 3.43cm/  involves bilateral iliac vessels 34mm   Heart murmur    Hyperlipidemia    Hypertension    Incomplete right bundle branch block    Left hydrocele     Past Surgical History:  Procedure Laterality Date   CARDIOVASCULAR STRESS TEST  09-22-2014  dr Jacinto Halim   normal perfusion study/  normal LV function and wall motion , ef 62%   HYDROCELE EXCISION Left 02/02/2015   Procedure: HYDROCELECTOMY ADULT;  Surgeon: Hildred Laser, MD;  Location: [facility];  Service: Urology;  Laterality: Left;   TRANSTHORACIC ECHOCARDIOGRAM  10-29-2014   dr Jacinto Halim   moderate concentric LVH, ef 60%/  mild AR, MR, and TR/  IVC borderline dilated w/ respiratory variation    Family History  Problem Relation Age of Onset   Heart disease Father    Heart disease Brother     Social History   Socioeconomic History   Marital status: Single    Spouse name: Not on file   Number of children: Not on file   Years of education: Not on file   Highest education level:  Not on file  Occupational History   Not on file  Tobacco Use   Smoking status: Current Every Day Smoker    Packs/day: 0.50    Years: 38.00    Pack years: 19.00    Types: Cigarettes   Smokeless tobacco: Never Used  Substance and Sexual Activity   Alcohol use: Yes    Alcohol/week: 0.0 standard drinks    Comment: 120 oz beer per day (3- 40 oz can)   Drug use: No    Sexual activity: Yes  Other Topics Concern   Not on file  Social History Narrative   Not on file   Social Determinants of Health   Financial Resource Strain:    Difficulty of Paying Living Expenses:   Food Insecurity:    Worried About Programme researcher, broadcasting/film/video in the Last Year:    Barista in the Last Year:   Transportation Needs:    Freight forwarder (Medical):    Lack of Transportation (Non-Medical):   Physical Activity:    Days of Exercise per Week:    Minutes of Exercise per Session:   Stress:    Feeling of Stress :   Social Connections:    Frequency of Communication with Friends and Family:    Frequency of Social Gatherings with Friends and Family:    Attends Religious Services:    Active Member of Clubs or Organizations:    Attends Engineer, structural:    Marital Status:      Observations/Objective: Awake, alert and oriented x 3   ROS  Assessment and Plan: Jaeden was seen today for leg pain.  Diagnoses and all orders for this visit:  Essential hypertension -     amLODipine (NORVASC) 10 MG tablet; Take 1 tablet (10 mg total) by mouth daily. -     losartan-hydrochlorothiazide (HYZAAR) 100-25 MG tablet; Take 1 tablet by mouth daily. Continue all antihypertensives as prescribed.  Remember to bring in your blood pressure log with you for your follow up appointment.  DASH/Mediterranean Diets are healthier choices for HTN.    Arthralgia of multiple joints -     gabapentin (NEURONTIN) 100 MG capsule; Take 1 capsule (100 mg total) by mouth 3 (three) times daily.     Follow Up Instructions Return in about 3 months (around 01/08/2020).     I discussed the assessment and treatment plan with the patient. The patient was provided an opportunity to ask questions and all were answered. The patient agreed with the plan and demonstrated an understanding of the instructions.   The patient was advised to call back or seek an in-person  evaluation if the symptoms worsen or if the condition fails to improve as anticipated.  I provided 16 minutes of non-face-to-face time during this encounter including median intraservice time, reviewing previous notes, labs, imaging, medications and explaining diagnosis and management.  Claiborne Rigg, FNP-BC

## 2019-10-09 MED FILL — LOSARTAN-HCTZ 100-25 MG TAB: 100-25 | 30 days supply | Qty: 30 | Fill #0

## 2019-10-09 MED FILL — GABAPENTIN 100 MG CAPSULE: 100 | 30 days supply | Qty: 90 | Fill #0

## 2019-10-09 MED FILL — AMLODIPINE BESYLATE 10 MG T: 10 | 30 days supply | Qty: 30 | Fill #0

## 2019-11-12 MED FILL — LOSARTAN-HCTZ 100-25 MG TAB: 100-25 | 30 days supply | Qty: 30 | Fill #1

## 2019-11-12 MED FILL — AMLODIPINE BESYLATE 10 MG T: 10 | 30 days supply | Qty: 30 | Fill #1

## 2019-11-12 MED FILL — GABAPENTIN 100 MG CAPSULE: 100 | 30 days supply | Qty: 90 | Fill #1

## 2019-12-19 ENCOUNTER — Other Ambulatory Visit: Payer: Self-pay | Admitting: Nurse Practitioner

## 2019-12-19 DIAGNOSIS — M2550 Pain in unspecified joint: Secondary | ICD-10-CM

## 2019-12-19 MED FILL — LOSARTAN-HCTZ 100-25 MG TAB: 100-25 | 30 days supply | Qty: 30 | Fill #3

## 2019-12-19 MED FILL — GABAPENTIN 100 MG CAPSULE: 100 | 30 days supply | Qty: 90 | Fill #0

## 2019-12-19 MED FILL — AMLODIPINE BESYLATE 10 MG T: 10 | 30 days supply | Qty: 30 | Fill #2

## 2019-12-19 NOTE — Telephone Encounter (Signed)
Requested Prescriptions  Pending Prescriptions Disp Refills  . gabapentin (NEURONTIN) 100 MG capsule [Pharmacy Med Name: GABAPENTIN 100 MG CAPSULE 100 Capsule] 90 capsule 1    Sig: TAKE 1 CAPSULE (100 MG TOTAL) BY MOUTH 3 (THREE) TIMES DAILY.     Neurology: Anticonvulsants - gabapentin Passed - 12/19/2019 10:22 AM      Passed - Valid encounter within last 12 months    Recent Outpatient Visits          2 months ago Essential hypertension   [place] [facility] [place], Shea Stakes, NP   5 months ago Essential hypertension   [place] [facility] [employer], Shea Stakes, NP   1 year ago Anxiety   [place] [facility] Hoy Register, MD   1 year ago Tobacco dependence   [place] [facility] Claiborne Rigg, NP   2 years ago Essential hypertension, benign   [facility] [place], Shea Stakes, NP      Future Appointments            In 2 weeks Claiborne Rigg, NP [employer] [facility]

## 2020-01-08 ENCOUNTER — Ambulatory Visit: Payer: Self-pay | Attending: Nurse Practitioner | Admitting: Nurse Practitioner

## 2020-01-08 ENCOUNTER — Other Ambulatory Visit: Payer: Self-pay

## 2020-01-08 ENCOUNTER — Other Ambulatory Visit: Payer: Self-pay | Admitting: Nurse Practitioner

## 2020-01-08 ENCOUNTER — Encounter: Payer: Self-pay | Admitting: Nurse Practitioner

## 2020-01-08 VITALS — BP 138/81 | HR 66 | Temp 97.7°F | Ht 71.0 in | Wt 219.0 lb

## 2020-01-08 DIAGNOSIS — B354 Tinea corporis: Secondary | ICD-10-CM

## 2020-01-08 DIAGNOSIS — I10 Essential (primary) hypertension: Secondary | ICD-10-CM

## 2020-01-08 DIAGNOSIS — M79605 Pain in left leg: Secondary | ICD-10-CM

## 2020-01-08 DIAGNOSIS — M79604 Pain in right leg: Secondary | ICD-10-CM

## 2020-01-08 DIAGNOSIS — Z8619 Personal history of other infectious and parasitic diseases: Secondary | ICD-10-CM

## 2020-01-08 DIAGNOSIS — R7303 Prediabetes: Secondary | ICD-10-CM

## 2020-01-08 LAB — POCT ABI - SCREENING FOR PILOT NO CHARGE
Left ABI: 1.29
Right ABI: 1.27

## 2020-01-08 LAB — POCT GLYCOSYLATED HEMOGLOBIN (HGB A1C): Hemoglobin A1C: 5.3 % (ref 4.0–5.6)

## 2020-01-08 LAB — GLUCOSE, POCT (MANUAL RESULT ENTRY): POC Glucose: 89 mg/dl (ref 70–99)

## 2020-01-08 MED ORDER — LOSARTAN POTASSIUM-HCTZ 100-25 MG PO TABS: 1.0000 | ORAL_TABLET | Freq: Every day | ORAL | 1 refills | Status: DC

## 2020-01-08 MED ORDER — TERBINAFINE HCL 1 % EX CREA: 1.0000 "application " | TOPICAL_CREAM | Freq: Two times a day (BID) | CUTANEOUS | 0 refills | Status: DC

## 2020-01-08 MED ORDER — MELOXICAM 7.5 MG PO TABS: 7.5000 mg | ORAL_TABLET | Freq: Every day | ORAL | 1 refills | Status: DC | PRN

## 2020-01-08 MED ORDER — AMLODIPINE BESYLATE 10 MG PO TABS: 10.0000 mg | ORAL_TABLET | Freq: Every day | ORAL | 1 refills | Status: DC

## 2020-01-08 MED ORDER — CYCLOBENZAPRINE HCL 5 MG PO TABS: 5.0000 mg | ORAL_TABLET | Freq: Every day | ORAL | 1 refills | Status: DC

## 2020-01-08 MED FILL — MELOXICAM 7.5 MG TABLET: 7.5 | 30 days supply | Qty: 30 | Fill #0

## 2020-01-08 MED FILL — CYCLOBENZAPRINE 5 MG TABLET: 5 | 30 days supply | Qty: 30 | Fill #0

## 2020-01-08 NOTE — Progress Notes (Signed)
Assessment & Plan:  Randall Ferrell was seen today for follow-up.  Diagnoses and all orders for this visit:  Essential hypertension -     amLODipine (NORVASC) 10 MG tablet; Take 1 tablet (10 mg total) by mouth daily. -     losartan-hydrochlorothiazide (HYZAAR) 100-25 MG tablet; Take 1 tablet by mouth daily. -     Basic metabolic panel Continue all antihypertensives as prescribed.  Remember to bring in your blood pressure log with you for your follow up appointment.  DASH/Mediterranean Diets are healthier choices for HTN.    Prediabetes -     Glucose (CBG) -     HgB A1c Continue blood sugar control as discussed in office today, low carbohydrate diet, and regular physical exercise as tolerated, 150 minutes per week (30 min each day, 5 days per week, or 50 min 3 days per week).    Bilateral leg pain -     POCT ABI Screening Pilot No Charge -     cyclobenzaprine (FLEXERIL) 5 MG tablet; Take 1 tablet (5 mg total) by mouth at bedtime. -     meloxicam (MOBIC) 7.5 MG tablet; Take 1 tablet (7.5 mg total) by mouth daily as needed for pain.  History of tinea corporis -     terbinafine (LAMISIL AT) 1 % cream; Apply 1 application topically 2 (two) times daily.    Patient has been counseled on age-appropriate routine health concerns for screening and prevention. These are reviewed and up-to-date. Referrals have been placed accordingly. Immunizations are up-to-date or declined.    Subjective:   Chief Complaint  Patient presents with  . Follow-up    Pt. is here for hypertension follow up.    HPI Randall Ferrell 54 y.o. male presents to office today for follow up.   has a past medical history of AAA (abdominal aortic aneurysm) (HCC) (per duplex at dr Jacinto Halim office 10-29-2014), Heart murmur, Hyperlipidemia, Hypertension, Incomplete right bundle branch block, and Left hydrocele.   Essential Hypertension Blood pressure is well controlled with amlodipine 10 mg daily and Hyzaar 100-25 mg daily.  Denies chest pain, shortness of breath, palpitations, lightheadedness, dizziness, headaches or BLE edema.  BP Readings from Last 3 Encounters:  01/08/20 138/81  07/09/19 (!) 151/80  11/10/17 126/79   Bilateral leg pain Worse in the am upon awakening then subsides as the day progresses. He is a smoker. ABI normal today. Pain is mostly present in the back of the hamstrings and posterior knees. Described as tight and aching sensation. Gabapentin 100 mg TID ineffective.    Prediabetes He is losing weight intentionally.  A1c has decreased and is improved. Currently prediabetes is diet controlled.  Lab Results  Component Value Date   HGBA1C 5.3 01/08/2020    Review of Systems  Constitutional: Negative for fever, malaise/fatigue and weight loss.  HENT: Negative.  Negative for nosebleeds.   Eyes: Negative.  Negative for blurred vision, double vision and photophobia.  Respiratory: Negative.  Negative for cough and shortness of breath.   Cardiovascular: Negative.  Negative for chest pain, palpitations and leg swelling.  Gastrointestinal: Negative.  Negative for heartburn, nausea and vomiting.  Musculoskeletal: Positive for joint pain and myalgias.  Skin: Negative for itching and rash.  Neurological: Negative.  Negative for dizziness, focal weakness, seizures and headaches.  Psychiatric/Behavioral: Negative.  Negative for suicidal ideas.    Past Medical History:  Diagnosis Date  . AAA (abdominal aortic aneurysm) (HCC) per duplex at dr Jacinto Halim office 10-29-2014   moderate distal  aorta  w/ bilateral iliac artery involvement 2.45cm X 3.43cm/  involves bilateral iliac vessels 60mm  . Heart murmur   . Hyperlipidemia   . Hypertension   . Incomplete right bundle branch block   . Left hydrocele     Past Surgical History:  Procedure Laterality Date  . CARDIOVASCULAR STRESS TEST  09-22-2014  dr Jacinto Halim   normal perfusion study/  normal LV function and wall motion , ef 62%  . HYDROCELE EXCISION  Left 02/02/2015   Procedure: HYDROCELECTOMY ADULT;  Surgeon: Hildred Laser, MD;  Location: [facility];  Service: Urology;  Laterality: Left;  . TRANSTHORACIC ECHOCARDIOGRAM  10-29-2014   dr Jacinto Halim   moderate concentric LVH, ef 60%/  mild AR, MR, and TR/  IVC borderline dilated w/ respiratory variation    Family History  Problem Relation Age of Onset  . Heart disease Father   . Heart disease Brother     Social History Reviewed with no changes to be made today.   Outpatient Medications Prior to Visit  Medication Sig Dispense Refill  . gabapentin (NEURONTIN) 100 MG capsule TAKE 1 CAPSULE (100 MG TOTAL) BY MOUTH 3 (THREE) TIMES DAILY. 90 capsule 1  . terbinafine (LAMISIL AT) 1 % cream Apply 1 application topically 2 (two) times daily. 100 g 0  . amLODipine (NORVASC) 10 MG tablet Take 1 tablet (10 mg total) by mouth daily. 90 tablet 1  . losartan-hydrochlorothiazide (HYZAAR) 100-25 MG tablet Take 1 tablet by mouth daily. 90 tablet 1   No facility-administered medications prior to visit.    No Known Allergies     Objective:    BP 138/81 (BP Location: Left Arm, Patient Position: Sitting, Cuff Size: Normal)   Pulse 66   Temp 97.7 F (36.5 C) (Temporal)   Ht 5\' 11"  (1.803 m)   Wt 219 lb (99.3 kg)   SpO2 99%   BMI 30.54 kg/m  Wt Readings from Last 3 Encounters:  01/08/20 219 lb (99.3 kg)  07/09/19 226 lb (102.5 kg)  11/10/17 233 lb 6.4 oz (105.9 kg)    Physical Exam Vitals and nursing note reviewed.  Constitutional:      Appearance: He is well-developed.  HENT:     Head: Normocephalic and atraumatic.  Cardiovascular:     Rate and Rhythm: Normal rate and regular rhythm.     Heart sounds: Normal heart sounds. No murmur heard.  No friction rub. No gallop.   Pulmonary:     Effort: Pulmonary effort is normal. No tachypnea or respiratory distress.     Breath sounds: Normal breath sounds. No decreased breath sounds, wheezing, rhonchi or rales.  Chest:      Chest wall: No tenderness.  Abdominal:     General: Bowel sounds are normal.     Palpations: Abdomen is soft.  Musculoskeletal:        General: Normal range of motion.     Cervical back: Normal range of motion.  Skin:    General: Skin is warm and dry.  Neurological:     Mental Status: He is alert and oriented to person, place, and time.     Coordination: Coordination normal.  Psychiatric:        Behavior: Behavior normal. Behavior is cooperative.        Thought Content: Thought content normal.        Judgment: Judgment normal.          Patient has been counseled extensively about nutrition and exercise as  well as the importance of adherence with medications and regular follow-up. The patient was given clear instructions to go to ER or return to medical center if symptoms don't improve, worsen or new problems develop. The patient verbalized understanding.   Follow-up: Return in about 3 months (around 04/09/2020).   Claiborne Rigg, FNP-BC [facility] [place], [place] [phone]   01/09/2020, 9:36 PM

## 2020-01-09 ENCOUNTER — Encounter: Payer: Self-pay | Admitting: Nurse Practitioner

## 2020-01-09 LAB — BASIC METABOLIC PANEL
BUN/Creatinine Ratio: 18 (ref 9–20)
BUN: 18 mg/dL (ref 6–24)
CO2: 21 mmol/L (ref 20–29)
Calcium: 10.1 mg/dL (ref 8.7–10.2)
Chloride: 99 mmol/L (ref 96–106)
Creatinine, Ser: 1 mg/dL (ref 0.76–1.27)
GFR calc Af Amer: 97 mL/min/{1.73_m2} (ref >59)
GFR calc non Af Amer: 84 mL/min/{1.73_m2} (ref >59)
Glucose: 83 mg/dL (ref 65–99)
Potassium: 3.9 mmol/L (ref 3.5–5.2)
Sodium: 138 mmol/L (ref 134–144)

## 2020-01-24 MED FILL — LOSARTAN-HCTZ 100-25 MG TAB: 100-25 | 30 days supply | Qty: 30 | Fill #4

## 2020-01-24 MED FILL — AMLODIPINE BESYLATE 10 MG T: 10 | 30 days supply | Qty: 30 | Fill #3

## 2020-02-10 MED FILL — CYCLOBENZAPRINE 5 MG TABLET: 5 | 30 days supply | Qty: 30 | Fill #1

## 2020-02-10 MED FILL — MELOXICAM 7.5 MG TABLET: 7.5 | 30 days supply | Qty: 30 | Fill #1

## 2020-03-02 MED FILL — AMLODIPINE BESYLATE 10 MG T: 10 | 30 days supply | Qty: 30 | Fill #4

## 2020-03-02 MED FILL — LOSARTAN-HCTZ 100-25 MG TAB: 100-25 | 30 days supply | Qty: 30 | Fill #5

## 2020-03-09 ENCOUNTER — Other Ambulatory Visit: Payer: Self-pay | Admitting: Nurse Practitioner

## 2020-03-09 DIAGNOSIS — M79604 Pain in right leg: Secondary | ICD-10-CM

## 2020-03-09 NOTE — Telephone Encounter (Signed)
Requested medication (s) are due for refill today: Yes  Requested medication (s) are on the active medication list: No  Last refill: 01/08/20  Future visit scheduled: Yes  Notes to clinic:  Medications not on med list.    Requested Prescriptions  Pending Prescriptions Disp Refills   cyclobenzaprine (FLEXERIL) 5 MG tablet [Pharmacy Med Name: CYCLOBENZAPRINE 5 MG TABLET 5 Tablet] 30 tablet 1    Sig: Take 1 tablet (5 mg total) by mouth at bedtime.      Not Delegated - Analgesics:  Muscle Relaxants Failed - 03/09/2020  8:50 AM      Failed - This refill cannot be delegated      Passed - Valid encounter within last 6 months    Recent Outpatient Visits           2 months ago Essential hypertension   [facility] [place], Shea Stakes, NP   5 months ago Essential hypertension   [facility] [place], Shea Stakes, NP   8 months ago Essential hypertension   [place] [facility] [place], Shea Stakes, NP   1 year ago Anxiety   [place] [facility] Hoy Register, MD   1 year ago Tobacco dependence   [facility] [place], Shea Stakes, NP       Future Appointments             In 1 month Claiborne Rigg, NP [place] [facility]               meloxicam (MOBIC) 7.5 MG tablet [Pharmacy Med Name: MELOXICAM 7.5 MG TABLET 7.5 Tablet] 30 tablet 1    Sig: Take 1 tablet (7.5 mg total) by mouth daily as needed for pain.      Analgesics:  COX2 Inhibitors Passed - 03/09/2020  8:50 AM      Passed - HGB in normal range and within 360 days    Hemoglobin  Date Value Ref Range Status  07/09/2019 15.0 13.0 - 17.7 g/dL Final          Passed - Cr in normal range and within 360 days    Creat  Date Value Ref Range Status  09/14/2014 0.87 0.50 - 1.35 mg/dL Final   Creatinine, Ser  Date Value Ref Range Status  01/08/2020 1.00 0.76 - 1.27 mg/dL Final           Passed - Patient is not pregnant      Passed - Valid encounter within last 12 months    Recent Outpatient Visits           2 months ago Essential hypertension   [facility] [place], Shea Stakes, NP   5 months ago Essential hypertension   [place] [facility] [place], Shea Stakes, NP   8 months ago Essential hypertension   [place] [place], Shea Stakes, NP   1 year ago Anxiety   [place] [facility] Hoy Register, MD   1 year ago Tobacco dependence   [place] [facility] Claiborne Rigg, NP       Future Appointments             In 1 month Claiborne Rigg, NP [facility]

## 2020-03-12 ENCOUNTER — Other Ambulatory Visit: Payer: Self-pay | Admitting: Nurse Practitioner

## 2020-03-12 MED FILL — MELOXICAM 7.5 MG TABLET: 7.5 | 30 days supply | Qty: 30 | Fill #0

## 2020-03-12 MED FILL — CYCLOBENZAPRINE 5 MG TABLET: 5 | 30 days supply | Qty: 30 | Fill #0

## 2020-04-07 MED FILL — AMLODIPINE BESYLATE 10 MG T: 10 | 30 days supply | Qty: 30 | Fill #5

## 2020-04-08 MED FILL — LOSARTAN-HCTZ 100-25 MG TAB: 100-25 | 30 days supply | Qty: 30 | Fill #0

## 2020-04-10 ENCOUNTER — Other Ambulatory Visit: Payer: Self-pay

## 2020-04-10 ENCOUNTER — Encounter: Payer: Self-pay | Admitting: Nurse Practitioner

## 2020-04-10 ENCOUNTER — Other Ambulatory Visit: Payer: Self-pay | Admitting: Nurse Practitioner

## 2020-04-10 ENCOUNTER — Ambulatory Visit: Payer: Self-pay | Attending: Nurse Practitioner | Admitting: Nurse Practitioner

## 2020-04-10 DIAGNOSIS — M79605 Pain in left leg: Secondary | ICD-10-CM

## 2020-04-10 DIAGNOSIS — M79604 Pain in right leg: Secondary | ICD-10-CM

## 2020-04-10 DIAGNOSIS — I10 Essential (primary) hypertension: Secondary | ICD-10-CM

## 2020-04-10 MED ORDER — AMLODIPINE BESYLATE 10 MG PO TABS: 10.0000 mg | ORAL_TABLET | Freq: Every day | ORAL | 0 refills | Status: DC

## 2020-04-10 MED ORDER — CYCLOBENZAPRINE HCL 5 MG PO TABS: 5.0000 mg | ORAL_TABLET | Freq: Every day | ORAL | 1 refills | Status: AC

## 2020-04-10 MED ORDER — LOSARTAN POTASSIUM-HCTZ 100-25 MG PO TABS: 1.0000 | ORAL_TABLET | Freq: Every day | ORAL | 0 refills | Status: DC

## 2020-04-10 MED ORDER — MELOXICAM 7.5 MG PO TABS: 7.5000 mg | ORAL_TABLET | Freq: Every day | ORAL | 1 refills | Status: AC | PRN

## 2020-04-10 MED FILL — MELOXICAM 7.5 MG TABLET: 7.5 | 30 days supply | Qty: 30 | Fill #0

## 2020-04-10 MED FILL — CYCLOBENZAPRINE 5 MG TABLET: 5 | 30 days supply | Qty: 30 | Fill #0

## 2020-04-10 NOTE — Progress Notes (Signed)
Virtual Visit via Telephone Note Due to national recommendations of social distancing due to COVID 19, telehealth visit is felt to be most appropriate for this patient at this time.  I discussed the limitations, risks, security and privacy concerns of performing an evaluation and management service by telephone and the availability of in person appointments. I also discussed with the patient that there may be a patient responsible charge related to this service. The patient expressed understanding and agreed to proceed.    I connected with [name] on 04/10/20  at   4:10 PM EST  EDT by telephone and verified that I am speaking with the correct person using two identifiers.   Consent I discussed the limitations, risks, security and privacy concerns of performing an evaluation and management service by telephone and the availability of in person appointments. I also discussed with the patient that there may be a patient responsible charge related to this service. The patient expressed understanding and agreed to proceed.   Location of Patient: Private  Residence   [facility] of Provider: [facility]    Persons participating in Telemedicine visit: Bertram Denver FNP-BC YY [place] CMA [name]    History of Present Illness: Telemedicine visit for: HTN   Essential Hypertension Blood pressure is well controlled. He is taking amlodipine 10 mg daily and losartan-hctz 100-25 mg daily.  I am still waiting on him to have his Abdominal US completed as he has a history of of AAA. Denies chest pain, shortness of breath, palpitations, lightheadedness, dizziness, headaches or BLE edema. He does continue to smoke.  BP Readings from Last 3 Encounters:  01/08/20 138/81  07/09/19 (!) 151/80  11/10/17 126/79    B/L Leg pain Well controlled with muscle relaxant and NSAID.   Past Medical History:  Diagnosis Date  . AAA (abdominal aortic aneurysm) (HCC) per  duplex at dr Jacinto Halim office 10-29-2014   moderate distal aorta  w/ bilateral iliac artery involvement 2.45cm X 3.43cm/  involves bilateral iliac vessels 37mm  . Heart murmur   . Hyperlipidemia   . Hypertension   . Incomplete right bundle branch block   . Left hydrocele     Past Surgical History:  Procedure Laterality Date  . CARDIOVASCULAR STRESS TEST  09-22-2014  dr Jacinto Halim   normal perfusion study/  normal LV function and wall motion , ef 62%  . HYDROCELE EXCISION Left 02/02/2015   Procedure: HYDROCELECTOMY ADULT;  Surgeon: Hildred Laser, MD;  Location: [facility];  Service: Urology;  Laterality: Left;  . TRANSTHORACIC ECHOCARDIOGRAM  10-29-2014   dr Jacinto Halim   moderate concentric LVH, ef 60%/  mild AR, MR, and TR/  IVC borderline dilated w/ respiratory variation    Family History  Problem Relation Age of Onset  . Heart disease Father   . Heart disease Brother     Social History   Socioeconomic History  . Marital status: Single    Spouse name: Not on file  . Number of children: Not on file  . Years of education: Not on file  . Highest education level: Not on file  Occupational History  . Not on file  Tobacco Use  . Smoking status: Current Every Day Smoker    Packs/day: 0.50    Years: 38.00    Pack years: 19.00    Types: Cigarettes  . Smokeless tobacco: Never Used  Substance and Sexual Activity  . Alcohol use: Yes    Alcohol/week: 0.0  standard drinks    Comment: 120 oz beer per day (3- 40 oz can)  . Drug use: No  . Sexual activity: Yes  Other Topics Concern  . Not on file  Social History Narrative  . Not on file   Social Determinants of Health   Financial Resource Strain: Not on file  Food Insecurity: Not on file  Transportation Needs: Not on file  Physical Activity: Not on file  Stress: Not on file  Social Connections: Not on file     Observations/Objective: Awake, alert and oriented x 3   Review of Systems  Constitutional: Negative for  fever, malaise/fatigue and weight loss.  HENT: Negative.  Negative for nosebleeds.   Eyes: Negative.  Negative for blurred vision, double vision and photophobia.  Respiratory: Negative.  Negative for cough and shortness of breath.   Cardiovascular: Negative.  Negative for chest pain, palpitations and leg swelling.  Gastrointestinal: Negative.  Negative for heartburn, nausea and vomiting.  Musculoskeletal: Negative.  Negative for myalgias.  Neurological: Negative.  Negative for dizziness, focal weakness, seizures and headaches.  Psychiatric/Behavioral: Negative.  Negative for suicidal ideas.    Assessment and Plan: Harlo was seen today for follow-up.  Diagnoses and all orders for this visit:  Essential hypertension Continue all antihypertensives as prescribed.  Remember to bring in your blood pressure log with you for your follow up appointment.  DASH/Mediterranean Diets are healthier choices for HTN.  Encouraged to stop smoking  Bilateral leg pain Stable     Follow Up Instructions Return in about 3 months (around 07/09/2020).     I discussed the assessment and treatment plan with the patient. The patient was provided an opportunity to ask questions and all were answered. The patient agreed with the plan and demonstrated an understanding of the instructions.   The patient was advised to call back or seek an in-person evaluation if the symptoms worsen or if the condition fails to improve as anticipated.  I provided 10 minutes of non-face-to-face time during this encounter including median intraservice time, reviewing previous notes, labs, imaging, medications and explaining diagnosis and management.  Claiborne Rigg, FNP-BC

## 2020-04-24 ENCOUNTER — Other Ambulatory Visit: Payer: Self-pay | Admitting: Nurse Practitioner

## 2020-04-24 ENCOUNTER — Other Ambulatory Visit: Payer: Self-pay

## 2020-04-24 ENCOUNTER — Ambulatory Visit: Payer: Self-pay | Attending: Nurse Practitioner

## 2020-04-24 DIAGNOSIS — E785 Hyperlipidemia, unspecified: Secondary | ICD-10-CM

## 2020-04-24 DIAGNOSIS — I10 Essential (primary) hypertension: Secondary | ICD-10-CM

## 2020-04-25 LAB — CMP14+EGFR
ALT: 16 IU/L (ref 0–44)
AST: 15 IU/L (ref 0–40)
Albumin/Globulin Ratio: 1.4 (ref 1.2–2.2)
Albumin: 4.6 g/dL (ref 3.8–4.9)
Alkaline Phosphatase: 92 IU/L (ref 44–121)
BUN/Creatinine Ratio: 13 (ref 9–20)
BUN: 12 mg/dL (ref 6–24)
Bilirubin Total: 1 mg/dL (ref 0.0–1.2)
CO2: 26 mmol/L (ref 20–29)
Calcium: 9.7 mg/dL (ref 8.7–10.2)
Chloride: 99 mmol/L (ref 96–106)
Creatinine, Ser: 0.96 mg/dL (ref 0.76–1.27)
GFR calc Af Amer: 102 mL/min/{1.73_m2} (ref >59)
GFR calc non Af Amer: 88 mL/min/{1.73_m2} (ref >59)
Globulin, Total: 3.2 g/dL (ref 1.5–4.5)
Glucose: 75 mg/dL (ref 65–99)
Potassium: 4.1 mmol/L (ref 3.5–5.2)
Sodium: 138 mmol/L (ref 134–144)
Total Protein: 7.8 g/dL (ref 6.0–8.5)

## 2020-04-25 LAB — LIPID PANEL
Chol/HDL Ratio: 4.9 ratio (ref 0.0–5.0)
Cholesterol, Total: 168 mg/dL (ref 100–199)
HDL: 34 mg/dL — ABNORMAL LOW (ref >39)
LDL Chol Calc (NIH): 117 mg/dL — ABNORMAL HIGH (ref 0–99)
Triglycerides: 91 mg/dL (ref 0–149)
VLDL Cholesterol Cal: 17 mg/dL (ref 5–40)

## 2020-04-30 ENCOUNTER — Other Ambulatory Visit: Payer: Self-pay | Admitting: Nurse Practitioner

## 2020-04-30 MED ORDER — ATORVASTATIN CALCIUM 20 MG PO TABS: 20.0000 mg | ORAL_TABLET | Freq: Every day | ORAL | 3 refills | Status: DC

## 2020-05-01 MED FILL — ATORVASTATIN CALCIUM 20 MG: 20 | 30 days supply | Qty: 30 | Fill #0

## 2020-05-05 MED FILL — LOSARTAN-HCTZ 100-25 MG TAB: 100-25 | 30 days supply | Qty: 30 | Fill #2

## 2020-05-05 MED FILL — AMLODIPINE BESYLATE 10 MG T: 10 | 30 days supply | Qty: 30 | Fill #0

## 2020-05-06 MED FILL — CYCLOBENZAPRINE 5 MG TABLET: 5 | 30 days supply | Qty: 30 | Fill #1

## 2020-05-06 MED FILL — MELOXICAM 7.5 MG TABLET: 7.5 | 30 days supply | Qty: 30 | Fill #1

## 2020-06-12 ENCOUNTER — Telehealth: Payer: Self-pay | Admitting: Nurse Practitioner

## 2020-06-12 NOTE — Telephone Encounter (Signed)
Provider [place] [facility] 4/22. Call Pt no answer left vm for Pt to call [phone] to reschedule appt.

## 2020-07-17 ENCOUNTER — Ambulatory Visit: Payer: Self-pay | Admitting: Nurse Practitioner

## 2020-07-22 ENCOUNTER — Other Ambulatory Visit: Payer: Self-pay

## 2020-07-27 MED FILL — Losartan Potassium & Hydrochlorothiazide Tab 100-25 MG: ORAL | 30 days supply | Qty: 30 | Fill #0 | Status: CN

## 2020-07-27 MED FILL — Amlodipine Besylate Tab 10 MG (Base Equivalent): ORAL | 30 days supply | Qty: 30 | Fill #0 | Status: AC

## 2020-07-28 ENCOUNTER — Other Ambulatory Visit: Payer: Self-pay

## 2020-07-28 MED FILL — Losartan Potassium & Hydrochlorothiazide Tab 100-25 MG: ORAL | 30 days supply | Qty: 30 | Fill #0 | Status: AC

## 2020-07-31 ENCOUNTER — Other Ambulatory Visit: Payer: Self-pay

## 2020-08-28 ENCOUNTER — Ambulatory Visit: Payer: Self-pay | Attending: Nurse Practitioner | Admitting: Nurse Practitioner

## 2020-08-28 ENCOUNTER — Other Ambulatory Visit: Payer: Self-pay

## 2020-08-28 ENCOUNTER — Encounter: Payer: Self-pay | Admitting: Nurse Practitioner

## 2020-08-28 VITALS — BP 147/81 | HR 71 | Ht 71.0 in | Wt 219.0 lb

## 2020-08-28 DIAGNOSIS — R7303 Prediabetes: Secondary | ICD-10-CM

## 2020-08-28 DIAGNOSIS — M2550 Pain in unspecified joint: Secondary | ICD-10-CM

## 2020-08-28 DIAGNOSIS — Z136 Encounter for screening for cardiovascular disorders: Secondary | ICD-10-CM

## 2020-08-28 DIAGNOSIS — Z87891 Personal history of nicotine dependence: Secondary | ICD-10-CM

## 2020-08-28 DIAGNOSIS — I10 Essential (primary) hypertension: Secondary | ICD-10-CM

## 2020-08-28 DIAGNOSIS — Z130 Encounter for screening for diseases of the blood and blood-forming organs and certain disorders involving the immune mechanism: Secondary | ICD-10-CM

## 2020-08-28 MED ORDER — LOSARTAN POTASSIUM-HCTZ 100-25 MG PO TABS
1.0000 | ORAL_TABLET | Freq: Every day | ORAL | 1 refills | Status: DC
  Filled 2020-08-28: qty 90, 90d supply, fill #0

## 2020-08-28 MED ORDER — AMLODIPINE BESYLATE 10 MG PO TABS
ORAL_TABLET | Freq: Every day | ORAL | 1 refills | Status: DC
  Filled 2020-08-28: qty 90, fill #0

## 2020-08-28 MED ORDER — ATORVASTATIN CALCIUM 20 MG PO TABS
ORAL_TABLET | Freq: Every day | ORAL | 3 refills | Status: DC
  Filled 2020-08-28: qty 90, fill #0

## 2020-08-28 MED ORDER — ATORVASTATIN CALCIUM 20 MG PO TABS
20.0000 mg | ORAL_TABLET | Freq: Every day | ORAL | 3 refills | Status: DC
Start: 2020-08-28 — End: 2021-06-08
  Filled 2020-08-28: qty 30, 30d supply, fill #0
  Filled 2020-11-18: qty 30, 30d supply, fill #1
  Filled 2020-12-23: qty 30, 30d supply, fill #2
  Filled 2021-01-29: qty 30, 30d supply, fill #3

## 2020-08-28 MED ORDER — LOSARTAN POTASSIUM-HCTZ 100-25 MG PO TABS
1.0000 | ORAL_TABLET | Freq: Every day | ORAL | 1 refills | Status: DC
  Filled 2020-08-28: qty 30, 30d supply, fill #0
  Filled 2020-10-05: qty 30, 30d supply, fill #1
  Filled 2020-11-18: qty 30, 30d supply, fill #2
  Filled 2020-12-23: qty 30, 30d supply, fill #3
  Filled 2021-01-29: qty 30, 30d supply, fill #4

## 2020-08-28 MED ORDER — MELOXICAM 15 MG PO TABS
15.0000 mg | ORAL_TABLET | Freq: Every day | ORAL | 0 refills | Status: DC
  Filled 2020-08-28: qty 30, 30d supply, fill #0

## 2020-08-28 MED ORDER — AMLODIPINE BESYLATE 10 MG PO TABS
10.0000 mg | ORAL_TABLET | Freq: Every day | ORAL | 1 refills | Status: DC
  Filled 2020-08-28: qty 30, 30d supply, fill #0
  Filled 2020-10-05: qty 30, 30d supply, fill #1
  Filled 2020-11-18: qty 30, 30d supply, fill #2
  Filled 2020-12-23: qty 30, 30d supply, fill #3
  Filled 2021-01-29: qty 30, 30d supply, fill #4

## 2020-08-28 NOTE — Progress Notes (Signed)
Assessment & Plan:  Randall Ferrell was seen today for hypertension.  Diagnoses and all orders for this visit:  Primary hypertension -     losartan-hydrochlorothiazide (HYZAAR) 100-25 MG tablet; Take 1 tablet by mouth daily. PASS PROGRAM -     amLODipine (NORVASC) 10 MG tablet; Take 1 tablet (10 mg total) by mouth daily. PASS PROGRAM -     CMP14+EGFR Continue all antihypertensives as prescribed.  Remember to bring in your blood pressure log with you for your follow up appointment.  DASH/Mediterranean Diets are healthier choices for HTN.   Prediabetes -     Hemoglobin A1c  Screening for deficiency anemia -     CBC  Encounter for screening for abdominal aortic aneurysm (AAA) in patient 68 years of age or older with history of smoking -     atorvastatin (LIPITOR) 20 MG tablet; Take 1 tablet (20 mg total) by mouth daily. PASS PROGRAM  Arthralgia of multiple joints -     meloxicam (MOBIC) 15 MG tablet; Take 1 tablet (15 mg total) by mouth daily.   History of AAA He still has not had his US performed to evaluate the size of his AAA.   Patient has been counseled on age-appropriate routine health concerns for screening and prevention. These are reviewed and up-to-date. Referrals have been placed accordingly. Immunizations are up-to-date or declined.    Subjective:   Chief Complaint  Patient presents with  . Hypertension   HPI Randall Ferrell 55 y.o. male presents to office today for follow up.    Essential Hypertension Blood pressure is slightly elevatted. He does continue to smoke tobacco products. Denies chest pain, shortness of breath, palpitations, lightheadedness, dizziness, headaches or BLE edema. Taking amlodipine 10 mg daily and hyzaar 100-25 mg daily.  BP Readings from Last 3 Encounters:  08/28/20 (!) 147/81  01/08/20 138/81  07/09/19 (!) 151/80    Dyslipidemia LDL not at goal with atorvastatin 20 mg daily as prescribed. Denies any history of statin intolerance or  myalgias.  Lab Results  Component Value Date   LDLCALC 117 (H) 04/24/2020   Arthralgias Still with intermittent joint pain. Would like to incrase mobic at this time. Untreated to any trauma or injury. His job does require extensive manual labor.    Review of Systems  Constitutional: Negative for fever, malaise/fatigue and weight loss.  HENT: Negative.  Negative for nosebleeds.   Eyes: Negative.  Negative for blurred vision, double vision and photophobia.  Respiratory: Negative.  Negative for cough and shortness of breath.   Cardiovascular: Negative.  Negative for chest pain, palpitations and leg swelling.  Gastrointestinal: Negative.  Negative for heartburn, nausea and vomiting.  Musculoskeletal: Positive for back pain, joint pain and myalgias.  Neurological: Negative.  Negative for dizziness, focal weakness, seizures and headaches.  Psychiatric/Behavioral: Negative.  Negative for suicidal ideas.    Past Medical History:  Diagnosis Date  . AAA (abdominal aortic aneurysm) ([place]) per duplex at dr Einar Gip office 10-29-2014   moderate distal aorta  w/ bilateral iliac artery involvement 2.45cm X 3.43cm/  involves bilateral iliac vessels 22m  . Heart murmur   . Hyperlipidemia   . Hypertension   . Incomplete right bundle branch block   . Left hydrocele     Past Surgical History:  Procedure Laterality Date  . CARDIOVASCULAR STRESS TEST  09-22-2014  dr gEinar Gip  normal perfusion study/  normal LV function and wall motion , ef 62%  . HYDROCELE EXCISION Left 02/02/2015   Procedure:  HYDROCELECTOMY ADULT;  Surgeon: Nickie Retort, MD;  Location: [facility];  Service: Urology;  Laterality: Left;  . TRANSTHORACIC ECHOCARDIOGRAM  10-29-2014   dr Einar Gip   moderate concentric LVH, ef 60%/  mild AR, MR, and TR/  IVC borderline dilated w/ respiratory variation    Family History  Problem Relation Age of Onset  . Heart disease Father   . Heart disease Brother     Social  History Reviewed with no changes to be made today.   Outpatient Medications Prior to Visit  Medication Sig Dispense Refill  . amLODipine (NORVASC) 10 MG tablet TAKE 1 TABLET (10 MG TOTAL) BY MOUTH DAILY. 90 tablet 0  . atorvastatin (LIPITOR) 20 MG tablet TAKE 1 TABLET (20 MG TOTAL) BY MOUTH DAILY. 90 tablet 3  . losartan-hydrochlorothiazide (HYZAAR) 100-25 MG tablet TAKE 1 TABLET BY MOUTH DAILY. 90 tablet 1  . meloxicam (MOBIC) 7.5 MG tablet TAKE 1 TABLET (7.5 MG TOTAL) BY MOUTH DAILY AS NEEDED FOR PAIN. 30 tablet 1  . terbinafine (LAMISIL AT) 1 % cream Apply 1 application topically 2 (two) times daily. (Patient not taking: No sig reported) 100 g 0  . amLODipine (NORVASC) 10 MG tablet Take 1 tablet (10 mg total) by mouth daily. 90 tablet 0  . amLODipine (NORVASC) 10 MG tablet TAKE 1 TABLET (10 MG TOTAL) BY MOUTH DAILY. 90 tablet 1  . atorvastatin (LIPITOR) 20 MG tablet Take 1 tablet (20 mg total) by mouth daily. 90 tablet 3  . cyclobenzaprine (FLEXERIL) 5 MG tablet TAKE 1 TABLET (5 MG TOTAL) BY MOUTH AT BEDTIME. 30 tablet 1  . cyclobenzaprine (FLEXERIL) 5 MG tablet TAKE 1 TABLET (5 MG TOTAL) BY MOUTH AT BEDTIME. 30 tablet 1  . cyclobenzaprine (FLEXERIL) 5 MG tablet TAKE 1 TABLET (5 MG TOTAL) BY MOUTH AT BEDTIME. 30 tablet 1  . losartan-hydrochlorothiazide (HYZAAR) 100-25 MG tablet Take 1 tablet by mouth daily. 90 tablet 0  . losartan-hydrochlorothiazide (HYZAAR) 100-25 MG tablet TAKE 1 TABLET BY MOUTH DAILY. 90 tablet 0  . losartan-hydrochlorothiazide (HYZAAR) 100-25 MG tablet TAKE 1 TABLET BY MOUTH DAILY. 90 tablet 1  . losartan-hydrochlorothiazide (HYZAAR) 100-25 MG tablet TAKE 1 TABLET BY MOUTH DAILY. 90 tablet 1  . meloxicam (MOBIC) 7.5 MG tablet TAKE 1 TABLET (7.5 MG TOTAL) BY MOUTH DAILY AS NEEDED FOR PAIN. 30 tablet 1  . meloxicam (MOBIC) 7.5 MG tablet TAKE 1 TABLET (7.5 MG TOTAL) BY MOUTH DAILY AS NEEDED FOR PAIN. 30 tablet 1   No facility-administered medications prior to visit.     No Known Allergies     Objective:    BP (!) 147/81   Pulse 71   Ht _0  (1.803 m)   Wt 219 lb (99.3 kg)   SpO2 98%   BMI 30.54 kg/m  Wt Readings from Last 3 Encounters:  08/28/20 219 lb (99.3 kg)  01/08/20 219 lb (99.3 kg)  07/09/19 226 lb (102.5 kg)    Physical Exam Vitals and nursing note reviewed.  Constitutional:      Appearance: He is well-developed.  HENT:     Head: Normocephalic and atraumatic.  Cardiovascular:     Rate and Rhythm: Normal rate and regular rhythm.     Heart sounds: Normal heart sounds. No murmur heard. No friction rub. No gallop.   Pulmonary:     Effort: Pulmonary effort is normal. No tachypnea or respiratory distress.     Breath sounds: Normal breath sounds. No decreased breath sounds, wheezing, rhonchi  or rales.  Chest:     Chest wall: No tenderness.  Abdominal:     General: Bowel sounds are normal.     Palpations: Abdomen is soft.  Musculoskeletal:        General: Normal range of motion.     Cervical back: Normal range of motion.  Skin:    General: Skin is warm and dry.  Neurological:     Mental Status: He is alert and oriented to person, place, and time.     Coordination: Coordination normal.  Psychiatric:        Behavior: Behavior normal. Behavior is cooperative.        Thought Content: Thought content normal.        Judgment: Judgment normal.          Patient has been counseled extensively about nutrition and exercise as well as the importance of adherence with medications and regular follow-up. The patient was given clear instructions to go to ER or return to medical center if symptoms don't improve, worsen or new problems develop. The patient verbalized understanding.   Follow-up: Return in about 6 months (around 02/27/2021).   Gildardo Pounds, FNP-BC [facility] and [place], [place]   08/30/2020, 2:03 PM

## 2020-08-29 LAB — CMP14+EGFR
ALT: 16 IU/L (ref 0–44)
AST: 10 IU/L (ref 0–40)
Albumin/Globulin Ratio: 1.4 (ref 1.2–2.2)
Albumin: 4.5 g/dL (ref 3.8–4.9)
Alkaline Phosphatase: 107 IU/L (ref 44–121)
BUN/Creatinine Ratio: 21 — ABNORMAL HIGH (ref 9–20)
BUN: 20 mg/dL (ref 6–24)
Bilirubin Total: 0.7 mg/dL (ref 0.0–1.2)
CO2: 22 mmol/L (ref 20–29)
Calcium: 10.1 mg/dL (ref 8.7–10.2)
Chloride: 104 mmol/L (ref 96–106)
Creatinine, Ser: 0.94 mg/dL (ref 0.76–1.27)
Globulin, Total: 3.2 g/dL (ref 1.5–4.5)
Glucose: 99 mg/dL (ref 65–99)
Potassium: 4.2 mmol/L (ref 3.5–5.2)
Sodium: 139 mmol/L (ref 134–144)
Total Protein: 7.7 g/dL (ref 6.0–8.5)
eGFR: 95 mL/min/{1.73_m2} (ref >59)

## 2020-08-29 LAB — CBC
Hematocrit: 43.5 % (ref 37.5–51.0)
Hemoglobin: 14.8 g/dL (ref 13.0–17.7)
MCH: 29.2 pg (ref 26.6–33.0)
MCHC: 34 g/dL (ref 31.5–35.7)
MCV: 86 fL (ref 79–97)
Platelets: 288 10*3/uL (ref 150–450)
RBC: 5.07 x10E6/uL (ref 4.14–5.80)
RDW: 12.5 % (ref 11.6–15.4)
WBC: 8.2 10*3/uL (ref 3.4–10.8)

## 2020-08-29 LAB — HEMOGLOBIN A1C
Est. average glucose Bld gHb Est-mCnc: 117 mg/dL
Hgb A1c MFr Bld: 5.7 % — ABNORMAL HIGH (ref 4.8–5.6)

## 2020-08-30 ENCOUNTER — Encounter: Payer: Self-pay | Admitting: Nurse Practitioner

## 2020-08-31 ENCOUNTER — Other Ambulatory Visit: Payer: Self-pay

## 2020-10-05 ENCOUNTER — Other Ambulatory Visit: Payer: Self-pay

## 2020-10-05 ENCOUNTER — Other Ambulatory Visit: Payer: Self-pay | Admitting: Nurse Practitioner

## 2020-10-05 DIAGNOSIS — M2550 Pain in unspecified joint: Secondary | ICD-10-CM

## 2020-10-05 MED ORDER — MELOXICAM 15 MG PO TABS
15.0000 mg | ORAL_TABLET | Freq: Every day | ORAL | 0 refills | Status: DC
  Filled 2020-10-05: qty 30, 30d supply, fill #0
  Filled 2020-11-18: qty 30, 30d supply, fill #1
  Filled 2020-12-23: qty 30, 30d supply, fill #2

## 2020-10-07 ENCOUNTER — Other Ambulatory Visit: Payer: Self-pay

## 2020-11-18 ENCOUNTER — Other Ambulatory Visit: Payer: Self-pay

## 2020-11-19 ENCOUNTER — Other Ambulatory Visit: Payer: Self-pay

## 2020-12-23 ENCOUNTER — Other Ambulatory Visit: Payer: Self-pay

## 2020-12-25 ENCOUNTER — Other Ambulatory Visit: Payer: Self-pay

## 2021-01-29 ENCOUNTER — Other Ambulatory Visit: Payer: Self-pay

## 2021-01-29 ENCOUNTER — Other Ambulatory Visit: Payer: Self-pay | Admitting: Nurse Practitioner

## 2021-01-29 DIAGNOSIS — M2550 Pain in unspecified joint: Secondary | ICD-10-CM

## 2021-01-29 MED ORDER — MELOXICAM 15 MG PO TABS
15.0000 mg | ORAL_TABLET | Freq: Every day | ORAL | 0 refills | Status: DC
  Filled 2021-01-29: qty 30, 30d supply, fill #0

## 2021-01-29 NOTE — Telephone Encounter (Signed)
Requested Prescriptions  Pending Prescriptions Disp Refills  . meloxicam (MOBIC) 15 MG tablet 90 tablet 0    Sig: Take 1 tablet (15 mg total) by mouth daily.     Analgesics:  COX2 Inhibitors Passed - 01/29/2021  8:24 AM      Passed - HGB in normal range and within 360 days    Hemoglobin  Date Value Ref Range Status  08/28/2020 14.8 13.0 - 17.7 g/dL Final         Passed - Cr in normal range and within 360 days    Creat  Date Value Ref Range Status  09/14/2014 0.87 0.50 - 1.35 mg/dL Final   Creatinine, Ser  Date Value Ref Range Status  08/28/2020 0.94 0.76 - 1.27 mg/dL Final         Passed - Patient is not pregnant      Passed - Valid encounter within last 12 months    Recent Outpatient Visits          5 months ago Primary hypertension   [facility] [place], Shea Stakes, NP   9 months ago Essential hypertension   [facility] [place], Shea Stakes, NP   1 year ago Essential hypertension   [facility] [place], Shea Stakes, NP   1 year ago Essential hypertension   [facility] [place], Shea Stakes, NP   1 year ago Essential hypertension    [facility] [place], Shea Stakes, NP      Future Appointments            In 1 month Claiborne Rigg, NP [facility]

## 2021-02-03 ENCOUNTER — Other Ambulatory Visit: Payer: Self-pay

## 2021-03-01 ENCOUNTER — Other Ambulatory Visit: Payer: Self-pay

## 2021-03-01 ENCOUNTER — Encounter: Payer: Self-pay | Admitting: Nurse Practitioner

## 2021-03-01 ENCOUNTER — Ambulatory Visit: Payer: Self-pay | Attending: Nurse Practitioner | Admitting: Nurse Practitioner

## 2021-03-01 VITALS — BP 167/99 | HR 61 | Ht 71.0 in | Wt 220.6 lb

## 2021-03-01 DIAGNOSIS — Z8679 Personal history of other diseases of the circulatory system: Secondary | ICD-10-CM

## 2021-03-01 DIAGNOSIS — B9689 Other specified bacterial agents as the cause of diseases classified elsewhere: Secondary | ICD-10-CM

## 2021-03-01 DIAGNOSIS — J019 Acute sinusitis, unspecified: Secondary | ICD-10-CM

## 2021-03-01 DIAGNOSIS — I10 Essential (primary) hypertension: Secondary | ICD-10-CM

## 2021-03-01 DIAGNOSIS — M79604 Pain in right leg: Secondary | ICD-10-CM

## 2021-03-01 DIAGNOSIS — Z1211 Encounter for screening for malignant neoplasm of colon: Secondary | ICD-10-CM

## 2021-03-01 DIAGNOSIS — M79605 Pain in left leg: Secondary | ICD-10-CM

## 2021-03-01 MED ORDER — LOSARTAN POTASSIUM-HCTZ 100-25 MG PO TABS
1.0000 | ORAL_TABLET | Freq: Every day | ORAL | 1 refills | Status: DC
  Filled 2021-03-01: qty 90, 90d supply, fill #0

## 2021-03-01 MED ORDER — METHOCARBAMOL 500 MG PO TABS
500.0000 mg | ORAL_TABLET | Freq: Four times a day (QID) | ORAL | 1 refills | Status: DC
  Filled 2021-03-01: qty 60, 15d supply, fill #0
  Filled 2021-03-31: qty 60, 15d supply, fill #1
  Filled 2021-04-05: qty 60, 15d supply, fill #0

## 2021-03-01 MED ORDER — FLUTICASONE PROPIONATE 50 MCG/ACT NA SUSP
2.0000 | Freq: Every day | NASAL | 6 refills | Status: DC
  Filled 2021-03-01: qty 16, 30d supply, fill #0

## 2021-03-01 MED ORDER — AMOXICILLIN-POT CLAVULANATE 875-125 MG PO TABS
1.0000 | ORAL_TABLET | Freq: Two times a day (BID) | ORAL | 0 refills | Status: AC
  Filled 2021-03-01: qty 10, 5d supply, fill #0

## 2021-03-01 MED ORDER — AMLODIPINE BESYLATE 10 MG PO TABS
10.0000 mg | ORAL_TABLET | Freq: Every day | ORAL | 1 refills | Status: DC
  Filled 2021-03-01: qty 30, 30d supply, fill #0
  Filled 2021-03-31: qty 30, 30d supply, fill #1
  Filled 2021-04-05: qty 30, 30d supply, fill #0
  Filled 2021-05-05: qty 30, 30d supply, fill #1

## 2021-03-01 MED ORDER — VALSARTAN-HYDROCHLOROTHIAZIDE 160-25 MG PO TABS
1.0000 | ORAL_TABLET | Freq: Every day | ORAL | 3 refills | Status: DC
  Filled 2021-03-01: qty 30, 30d supply, fill #0
  Filled 2021-03-31: qty 30, 30d supply, fill #1
  Filled 2021-04-05: qty 30, 30d supply, fill #0
  Filled 2021-05-05: qty 30, 30d supply, fill #1

## 2021-03-01 NOTE — Progress Notes (Signed)
?   Sinus infection

## 2021-03-01 NOTE — Progress Notes (Signed)
Assessment & Plan:  Randall Ferrell was seen today for hypertension.  Diagnoses and all orders for this visit:  Primary hypertension -     valsartan-hydrochlorothiazide (DIOVAN-HCT) 160-25 MG tablet; Take 1 tablet by mouth daily. -     amLODipine (NORVASC) 10 MG tablet; Take 1 tablet (10 mg total) by mouth daily. -     Discontinue: losartan-hydrochlorothiazide (HYZAAR) 100-25 MG tablet; Take 1 tablet by mouth daily. -     CMP14+EGFR  Colon cancer screening -     Fecal occult blood, imunochemical([facility])  Acute bacterial sinusitis -     fluticasone (FLONASE) 50 MCG/ACT nasal spray; Place 2 sprays into both nostrils daily. -     amoxicillin-clavulanate (AUGMENTIN) 875-125 MG tablet; Take 1 tablet by mouth 2 (two) times daily for 5 days.  Bilateral leg pain -     methocarbamol (ROBAXIN) 500 MG tablet; Take 1 tablet (500 mg total) by mouth 4 (four) times daily.  History of abdominal aortic aneurysm (AAA) -     VAS [place] AAA DUPLEX; Future   Patient has been counseled on age-appropriate routine health concerns for screening and prevention. These are reviewed and up-to-date. Referrals have been placed accordingly. Immunizations are up-to-date or declined.    Subjective:   Chief Complaint  Patient presents with   Hypertension    Randall Ferrell 55 y.o. male presents to office today fo  follow up to HTN  has a past medical history of AAA (abdominal aortic aneurysm) (per duplex at dr Einar Gip office 10-29-2014), Heart murmur, Hyperlipidemia, Hypertension, Incomplete right bundle branch block, and Left hydrocele.   HTN Blood pressure poorly controlled. Will dc losartan-HCTZ 100-25 and try him on diovan HCT 160-25 mg daily. He will continue on amlodipine 10 mg daily. Needs duplex to evaluate size of previous AAA noted by Dr. Einar Gip in 2016. I have instructed him to apply for the financial assistance program.  BP Readings from Last 3 Encounters:  03/01/21 (!) 167/99  08/28/20 (!) 147/81   01/08/20 138/81    Sinus Pain: Patient complains of achiness, congestion, facial pain, nasal congestion, non productive cough, post nasal drip, purulent nasal discharge, and sinus pressure. Onset of symptoms was 3 weeks ago, unchanged since that time.  Patient is a current smoker.    Review of Systems  Constitutional:  Negative for fever, malaise/fatigue and weight loss.  HENT:  Positive for congestion and sinus pain. Negative for nosebleeds.   Eyes: Negative.  Negative for blurred vision, double vision and photophobia.  Respiratory:  Positive for cough. Negative for shortness of breath.   Cardiovascular: Negative.  Negative for chest pain, palpitations and leg swelling.  Gastrointestinal: Negative.  Negative for heartburn, nausea and vomiting.  Musculoskeletal:  Positive for myalgias (legs cramp up at work).  Neurological:  Positive for headaches. Negative for dizziness, focal weakness and seizures.  Psychiatric/Behavioral: Negative.  Negative for suicidal ideas.    Past Medical History:  Diagnosis Date   AAA (abdominal aortic aneurysm) per duplex at dr Einar Gip office 10-29-2014   moderate distal aorta  w/ bilateral iliac artery involvement 2.45cm X 3.43cm/  involves bilateral iliac vessels 43m   Heart murmur    Hyperlipidemia    Hypertension    Incomplete right bundle branch block    Left hydrocele     Past Surgical History:  Procedure Laterality Date   CARDIOVASCULAR STRESS TEST  09-22-2014  dr gEinar Gip  normal perfusion study/  normal LV function and wall motion , ef 62%  HYDROCELE EXCISION Left 02/02/2015   Procedure: HYDROCELECTOMY ADULT;  Surgeon: Nickie Retort, MD;  Location: [facility];  Service: Urology;  Laterality: Left;   TRANSTHORACIC ECHOCARDIOGRAM  10-29-2014   dr Einar Gip   moderate concentric LVH, ef 60%/  mild AR, MR, and TR/  IVC borderline dilated w/ respiratory variation    Family History  Problem Relation Age of Onset   Heart disease  Father    Heart disease Brother     Social History Reviewed with no changes to be made today.   Outpatient Medications Prior to Visit  Medication Sig Dispense Refill   atorvastatin (LIPITOR) 20 MG tablet Take 1 tablet (20 mg total) by mouth daily. 90 tablet 3   meloxicam (MOBIC) 15 MG tablet Take 1 tablet (15 mg total) by mouth daily. 90 tablet 0   terbinafine (LAMISIL AT) 1 % cream Apply 1 application topically 2 (two) times daily. 100 g 0   amLODipine (NORVASC) 10 MG tablet Take 1 tablet (10 mg total) by mouth daily. 90 tablet 1   losartan-hydrochlorothiazide (HYZAAR) 100-25 MG tablet Take 1 tablet by mouth daily. 90 tablet 1   No facility-administered medications prior to visit.    No Known Allergies     Objective:    BP (!) 167/99   Pulse 61   Ht '5\' 11"'  (1.803 m)   Wt 220 lb 9.6 oz (100.1 kg)   SpO2 100%   BMI 30.77 kg/m  Wt Readings from Last 3 Encounters:  03/01/21 220 lb 9.6 oz (100.1 kg)  08/28/20 219 lb (99.3 kg)  01/08/20 219 lb (99.3 kg)    Physical Exam Constitutional:      Appearance: He is well-developed. He is not ill-appearing.  HENT:     Head: Normocephalic.     Right Ear: Hearing, ear canal and external ear normal. A middle ear effusion is present.     Left Ear: Hearing, ear canal and external ear normal. A middle ear effusion is present.     Nose: Mucosal edema and rhinorrhea present.     Right Turbinates: Enlarged and swollen.     Left Turbinates: Enlarged and swollen.     Right Sinus: Frontal sinus tenderness present. No maxillary sinus tenderness.     Left Sinus: Frontal sinus tenderness present. No maxillary sinus tenderness.     Mouth/Throat:     Pharynx: Posterior oropharyngeal erythema present. No oropharyngeal exudate.     Tonsils: No tonsillar abscesses.  Neck:     Thyroid: No thyromegaly.  Cardiovascular:     Rate and Rhythm: Normal rate and regular rhythm.     Heart sounds: No murmur heard.   No friction rub. No gallop.  Pulmonary:      Effort: Pulmonary effort is normal. No respiratory distress.     Breath sounds: Normal breath sounds. No wheezing or rales.  Chest:     Chest wall: No tenderness.  Abdominal:     General: Bowel sounds are normal.     Palpations: Abdomen is soft.  Musculoskeletal:        General: Normal range of motion.     Cervical back: Normal range of motion.  Lymphadenopathy:     Cervical: No cervical adenopathy.  Skin:    General: Skin is warm and dry.  Neurological:     Mental Status: He is alert and oriented to person, place, and time.  Psychiatric:        Behavior: Behavior normal.  Thought Content: Thought content normal.        Judgment: Judgment normal.         Patient has been counseled extensively about nutrition and exercise as well as the importance of adherence with medications and regular follow-up. The patient was given clear instructions to go to ER or return to medical center if symptoms don't improve, worsen or new problems develop. The patient verbalized understanding.   Follow-up: Return for luke 2 weeks bp check on a friday the 23rd of december is possible. See me in 3 months.   Gildardo Pounds, FNP-BC [facility] and [place], [place]   03/01/2021, 10:31 PM

## 2021-03-02 ENCOUNTER — Other Ambulatory Visit: Payer: Self-pay

## 2021-03-02 LAB — CMP14+EGFR
ALT: 14 IU/L (ref 0–44)
AST: 13 IU/L (ref 0–40)
Albumin/Globulin Ratio: 1.4 (ref 1.2–2.2)
Albumin: 4.3 g/dL (ref 3.8–4.9)
Alkaline Phosphatase: 101 IU/L (ref 44–121)
BUN/Creatinine Ratio: 12 (ref 9–20)
BUN: 12 mg/dL (ref 6–24)
Bilirubin Total: 0.8 mg/dL (ref 0.0–1.2)
CO2: 24 mmol/L (ref 20–29)
Calcium: 9.9 mg/dL (ref 8.7–10.2)
Chloride: 101 mmol/L (ref 96–106)
Creatinine, Ser: 1 mg/dL (ref 0.76–1.27)
Globulin, Total: 3.1 g/dL (ref 1.5–4.5)
Glucose: 123 mg/dL — ABNORMAL HIGH (ref 70–99)
Potassium: 4.1 mmol/L (ref 3.5–5.2)
Sodium: 137 mmol/L (ref 134–144)
Total Protein: 7.4 g/dL (ref 6.0–8.5)
eGFR: 88 mL/min/{1.73_m2} (ref >59)

## 2021-03-03 ENCOUNTER — Other Ambulatory Visit: Payer: Self-pay

## 2021-03-16 ENCOUNTER — Telehealth: Payer: Self-pay | Admitting: *Deleted

## 2021-03-16 NOTE — Telephone Encounter (Signed)
Patient verified DOB Patient is aware of AAA being ordered due to his Hx and complaint during his 03/01/21 visit with PCP. Patient shares he has no transportation for tomorrow 8 am appointment. MA contacted [facility] 3 times before leaving a message with patient information to return and reschedule directly with patient.. Patient is also aware of keeping 12/21 appointment and obtaining financial packet while in office.

## 2021-03-17 ENCOUNTER — Inpatient Hospital Stay (HOSPITAL_COMMUNITY): Admission: RE | Admit: 2021-03-17 | Payer: Self-pay | Source: Ambulatory Visit

## 2021-03-18 ENCOUNTER — Ambulatory Visit: Payer: Self-pay | Attending: Nurse Practitioner | Admitting: Pharmacist

## 2021-03-18 ENCOUNTER — Other Ambulatory Visit: Payer: Self-pay

## 2021-03-18 ENCOUNTER — Encounter: Payer: Self-pay | Admitting: Pharmacist

## 2021-03-18 VITALS — BP 156/82

## 2021-03-18 DIAGNOSIS — I10 Essential (primary) hypertension: Secondary | ICD-10-CM

## 2021-03-18 NOTE — Progress Notes (Signed)
° °  S:    PCP: Randall Ferrell   Patient arrives in good spirits. Presents to the clinic for hypertension evaluation, counseling, and management. Patient was referred and last seen by Primary Care Provider on 03/01/2021. BP was elevated at that appt. Losartan-HCTZ was changed to valsartan-HCTZ. Labs done at that visit revealed stable renal function and electrolyte status was normal.   Medication adherence reported. He has taken his medication today.   Current BP Medications include: amlodipine 10 mg daily, valsartan-HCTZ 160-25 mg daily  Dietary habits include: does not limit sodium. Denies drinking excessive caffeine.  Exercise habits include: none outside of work  Family / Social history:  -Fhx: heart disease  -Tobacco: current 0.5 PPD smoker  -Alcohol: three 40 oz beers daily   O:  Vitals:   03/18/21 1508  BP: (!) 156/82   Home BP readings: none  Last 3 Office BP readings: BP Readings from Last 3 Encounters:  03/01/21 (!) 167/99  08/28/20 (!) 147/81  01/08/20 138/81   BMET    Component Value Date/Time   NA 137 03/01/2021 1630   K 4.1 03/01/2021 1630   CL 101 03/01/2021 1630   CO2 24 03/01/2021 1630   GLUCOSE 123 (H) 03/01/2021 1630   GLUCOSE 123 (H) 04/02/2017 1523   BUN 12 03/01/2021 1630   CREATININE 1.00 03/01/2021 1630   CREATININE 0.87 09/14/2014 1020   CALCIUM 9.9 03/01/2021 1630   GFRNONAA 88 04/24/2020 1607   GFRAA 102 04/24/2020 1607    Renal function: CrCl cannot be calculated (Unknown ideal weight.).  Clinical ASCVD: No  The 10-year ASCVD risk score (Arnett DK, et al., 2019) is: 32.3%   Values used to calculate the score:     Age: 36 years     Sex: Male     Is Non-Hispanic African American: Yes     Diabetic: No     Tobacco smoker: Yes     Systolic Blood Pressure: 167 mmHg     Is BP treated: Yes     HDL Cholesterol: 34 mg/dL     Total Cholesterol: 168 mg/dL  A/P: Hypertension diagnosed currently above goal on current medications. BP Goal = < 130/80  mmHg. Medication adherence reported. BP improved since switching to valsartan-HCTZ but still above goal. Discussed option to increase to 320-25 mg dose of  valsartan-HCTZ but pt would prefer to wait until his next PCP visit.  -Continued amlodipine 10 mg daily. -Continue valsartan-HCTZ 160-25 mg daily.   -Counseled on lifestyle modifications for blood pressure control including reduced dietary sodium, increased exercise, adequate sleep.  Results reviewed and written information provided.   Total time in face-to-face counseling 10 minutes.   F/U Clinic Visit in March.    Butch Penny, PharmD, [place]y [place], CPP Clinical Pharmacist [facility] [phone]

## 2021-03-20 LAB — FECAL OCCULT BLOOD, IMMUNOCHEMICAL: Fecal Occult Bld: NEGATIVE

## 2021-03-26 ENCOUNTER — Other Ambulatory Visit: Payer: Self-pay

## 2021-03-31 ENCOUNTER — Other Ambulatory Visit: Payer: Self-pay

## 2021-04-05 ENCOUNTER — Other Ambulatory Visit: Payer: Self-pay

## 2021-04-07 ENCOUNTER — Other Ambulatory Visit: Payer: Self-pay

## 2021-04-09 ENCOUNTER — Ambulatory Visit (HOSPITAL_COMMUNITY)
Admission: RE | Admit: 2021-04-09 | Discharge: 2021-04-09 | Disposition: A | Discharge status: Home / Self Care | Payer: Self-pay | Source: Ambulatory Visit | Attending: Cardiology | Admitting: Cardiology

## 2021-04-09 ENCOUNTER — Other Ambulatory Visit: Payer: Self-pay

## 2021-04-09 DIAGNOSIS — Z8679 Personal history of other diseases of the circulatory system: Secondary | ICD-10-CM

## 2021-05-05 ENCOUNTER — Other Ambulatory Visit: Payer: Self-pay | Admitting: Nurse Practitioner

## 2021-05-05 ENCOUNTER — Other Ambulatory Visit: Payer: Self-pay

## 2021-05-05 DIAGNOSIS — M79604 Pain in right leg: Secondary | ICD-10-CM

## 2021-05-05 NOTE — Telephone Encounter (Signed)
Requested medication (s) are due for refill today:   Provider to review  Requested medication (s) are on the active medication list:   Yes  Future visit scheduled:   Yes   Last ordered: 03/01/2021 #60, 1 refill  Returned because it's a non delegated refill   Requested Prescriptions  Pending Prescriptions Disp Refills   methocarbamol (ROBAXIN) 500 MG tablet 60 tablet 1    Sig: Take 1 tablet (500 mg total) by mouth 4 (four) times daily.     Not Delegated - Analgesics:  Muscle Relaxants Failed - 05/05/2021 10:13 AM      Failed - This refill cannot be delegated      Passed - Valid encounter within last 6 months    Recent Outpatient Visits           1 month ago Primary hypertension   [place], RPH-CPP   2 months ago Primary hypertension   [place], Vernia Buff, NP   8 months ago Primary hypertension   [place], Vernia Buff, NP   1 year ago Essential hypertension   [place], Vernia Buff, NP   1 year ago Essential hypertension   [place], [name] W, NP       Future Appointments             In 1 month Gildardo Pounds, NP [place]

## 2021-05-06 ENCOUNTER — Other Ambulatory Visit: Payer: Self-pay

## 2021-05-06 MED ORDER — METHOCARBAMOL 500 MG PO TABS
500.0000 mg | ORAL_TABLET | Freq: Four times a day (QID) | ORAL | 0 refills | Status: DC
  Filled 2021-05-06: qty 60, 15d supply, fill #0

## 2021-05-07 ENCOUNTER — Other Ambulatory Visit: Payer: Self-pay

## 2021-06-04 ENCOUNTER — Ambulatory Visit: Payer: Self-pay | Admitting: Nurse Practitioner

## 2021-06-08 ENCOUNTER — Encounter: Payer: Self-pay | Admitting: Nurse Practitioner

## 2021-06-08 ENCOUNTER — Other Ambulatory Visit: Payer: Self-pay

## 2021-06-08 ENCOUNTER — Telehealth: Payer: Self-pay | Admitting: Nurse Practitioner

## 2021-06-08 ENCOUNTER — Ambulatory Visit: Payer: Self-pay | Attending: Nurse Practitioner | Admitting: Nurse Practitioner

## 2021-06-08 DIAGNOSIS — M2550 Pain in unspecified joint: Secondary | ICD-10-CM

## 2021-06-08 DIAGNOSIS — I10 Essential (primary) hypertension: Secondary | ICD-10-CM

## 2021-06-08 DIAGNOSIS — E785 Hyperlipidemia, unspecified: Secondary | ICD-10-CM

## 2021-06-08 MED ORDER — AMLODIPINE BESYLATE 10 MG PO TABS
10.0000 mg | ORAL_TABLET | Freq: Every day | ORAL | 1 refills | Status: DC
  Filled 2021-06-08: qty 30, 30d supply, fill #0
  Filled 2021-07-09: qty 30, 30d supply, fill #1

## 2021-06-08 MED ORDER — VALSARTAN-HYDROCHLOROTHIAZIDE 160-25 MG PO TABS
1.0000 | ORAL_TABLET | Freq: Every day | ORAL | 3 refills | Status: DC
  Filled 2021-06-08: qty 30, 30d supply, fill #0
  Filled 2021-07-09: qty 30, 30d supply, fill #1

## 2021-06-08 MED ORDER — TIZANIDINE HCL 4 MG PO TABS
4.0000 mg | ORAL_TABLET | Freq: Four times a day (QID) | ORAL | 3 refills | Status: DC | PRN
  Filled 2021-06-08: qty 60, 15d supply, fill #0
  Filled 2021-07-09: qty 60, 15d supply, fill #1

## 2021-06-08 MED ORDER — MELOXICAM 15 MG PO TABS
15.0000 mg | ORAL_TABLET | Freq: Every day | ORAL | 0 refills | Status: DC
  Filled 2021-06-08: qty 30, 30d supply, fill #0

## 2021-06-08 MED ORDER — ATORVASTATIN CALCIUM 20 MG PO TABS
20.0000 mg | ORAL_TABLET | Freq: Every day | ORAL | 3 refills | Status: DC
  Filled 2021-06-08: qty 30, 30d supply, fill #0
  Filled 2021-07-09: qty 30, 30d supply, fill #1

## 2021-06-08 NOTE — Telephone Encounter (Signed)
No answer. LVM ?

## 2021-06-08 NOTE — Progress Notes (Signed)
Virtual Visit via Telephone Note ?Due to national recommendations of social distancing due to COVID 19, telehealth visit is felt to be most appropriate for this patient at this time.  I discussed the limitations, risks, security and privacy concerns of performing an evaluation and management service by telephone and the availability of in person appointments. I also discussed with the patient that there may be a patient responsible charge related to this service. The patient expressed understanding and agreed to proceed.  ? ? ?I connected with Randall Ferrell on 06/08/21  at   2:30 PM EDT  EDT by telephone and verified that I am speaking with the correct person using two identifiers. ? ?Location of Patient: ?Private Residence ?  ?Location of Provider: ?[employer] [facility] [employer] [facility]  ?  ?Persons participating in Telemedicine visit: ?Randall Denver FNP-BC ?Randall Ferrell  ?  ?History of Present Illness: ?Telemedicine visit for: HTN and leg pain ? ?Blood pressure poorly controlled. He was started on diovan HCT 160-25 mg daily a few months ago and also takes amlodipine 10mg  daily.  May likely need to increase diovan to 320-25 at next office visit.  ?   ?BP Readings from Last 3 Encounters:  ?03/01/21 (!) 167/99  ?08/28/20 (!) 147/81  ?01/08/20 138/81  ? ?Arthralgias ?Still with intermittent joint pain. Currently on mobic and robaxin. Will switch to tizanidine in place of muscle relaxant. Pain is unrelated to any trauma or injury. His job does require extensive manual labor. Pain mostly in BLE. ABI normal.  ? ? ?Past Medical History:  ?Diagnosis Date  ? AAA (abdominal aortic aneurysm) per duplex at dr 01/10/20 office 10-29-2014  ? moderate distal aorta  w/ bilateral iliac artery involvement 2.45cm X 3.43cm/  involves bilateral iliac vessels 54mm  ? Heart murmur   ? Hyperlipidemia   ? Hypertension   ? Incomplete right bundle branch block   ? Left hydrocele   ?  ?Past Surgical History:  ?Procedure Laterality  Date  ? CARDIOVASCULAR STRESS TEST  09-22-2014  dr 09-24-2014  ? normal perfusion study/  normal LV function and wall motion , ef 62%  ? HYDROCELE EXCISION Left 02/02/2015  ? Procedure: HYDROCELECTOMY ADULT;  Surgeon: 13/09/2014, MD;  Location: [facility];  Service: Urology;  Laterality: Left;  ? TRANSTHORACIC ECHOCARDIOGRAM  10-29-2014   dr 12-29-2014  ? moderate concentric LVH, ef 60%/  mild AR, MR, and TR/  IVC borderline dilated w/ respiratory variation  ?  ?Family History  ?Problem Relation Age of Onset  ? Heart disease Father   ? Heart disease Brother   ?  ?Social History  ? ?Socioeconomic History  ? Marital status: Single  ?  Spouse name: Not on file  ? Number of children: Not on file  ? Years of education: Not on file  ? Highest education level: Not on file  ?Occupational History  ? Not on file  ?Tobacco Use  ? Smoking status: Every Day  ?  Packs/day: 0.50  ?  Years: 38.00  ?  Pack years: 19.00  ?  Types: Cigarettes  ? Smokeless tobacco: Never  ?Substance and Sexual Activity  ? Alcohol use: Yes  ?  Alcohol/week: 0.0 standard drinks  ?  Comment: 120 oz beer per day (3- 40 oz can)  ? Drug use: No  ? Sexual activity: Yes  ?Other Topics Concern  ? Not on file  ?Social History Narrative  ? Not on file  ? ?Social Determinants of  Health  ? ?Financial Resource Strain: Not on file  ?Food Insecurity: Not on file  ?Transportation Needs: Not on file  ?Physical Activity: Not on file  ?Stress: Not on file  ?Social Connections: Not on file  ?  ? ?Observations/Objective: ?Awake, alert and oriented x 3 ? ? ?Review of Systems  ?Constitutional:  Negative for fever, malaise/fatigue and weight loss.  ?HENT: Negative.  Negative for nosebleeds.   ?Eyes: Negative.  Negative for blurred vision, double vision and photophobia.  ?Respiratory: Negative.  Negative for cough and shortness of breath.   ?Cardiovascular: Negative.  Negative for chest pain, palpitations and leg swelling.  ?Gastrointestinal: Negative.  Negative  for heartburn, nausea and vomiting.  ?Musculoskeletal:  Positive for joint pain. Negative for myalgias.  ?Neurological: Negative.  Negative for dizziness, focal weakness, seizures and headaches.  ?Psychiatric/Behavioral: Negative.  Negative for suicidal ideas.    ?Assessment and Plan: ?Diagnoses and all orders for this visit: ? ?Primary hypertension ?-     amLODipine (NORVASC) 10 MG tablet; Take 1 tablet (10 mg total) by mouth daily. ?-     valsartan-hydrochlorothiazide (DIOVAN-HCT) 160-25 MG tablet; Take 1 tablet by mouth daily. ?Continue all antihypertensives as prescribed.  ?Remember to bring in your blood pressure log with you for your follow up appointment.  ?DASH/Mediterranean Diets are healthier choices for HTN.   ? ?Arthralgia of multiple joints ?-     meloxicam (MOBIC) 15 MG tablet; Take 1 tablet (15 mg total) by mouth daily. ?-     tiZANidine (ZANAFLEX) 4 MG tablet; Take 1 tablet (4 mg total) by mouth every 6 (six) hours as needed for muscle spasms. Or leg pain ? ?Dyslipidemia, goal LDL below 70 ?-     atorvastatin (LIPITOR) 20 MG tablet; Take 1 tablet (20 mg total) by mouth daily. ?INSTRUCTIONS: Work on a low fat, heart healthy diet and participate in regular aerobic exercise program by working out at least 150 minutes per week; 5 days a week-30 minutes per day. Avoid red meat/beef/steak,  fried foods. junk foods, sodas, sugary drinks, unhealthy snacking, alcohol and smoking.  Drink at least 80 oz of water per day and monitor your carbohydrate intake daily.   ? ?  ? ?Follow Up Instructions ?No follow-ups on file.  ? ?  ?I discussed the assessment and treatment plan with the patient. The patient was provided an opportunity to ask questions and all were answered. The patient agreed with the plan and demonstrated an understanding of the instructions. ?  ?The patient was advised to call back or seek an in-person evaluation if the symptoms worsen or if the condition fails to improve as anticipated. ? ?I  provided 12 minutes of non-face-to-face time during this encounter including median intraservice time, reviewing previous notes, labs, imaging, medications and explaining diagnosis and management. ? ?Claiborne Rigg, FNP-BC  ?

## 2021-06-09 ENCOUNTER — Other Ambulatory Visit: Payer: Self-pay

## 2021-07-09 ENCOUNTER — Other Ambulatory Visit: Payer: Self-pay

## 2021-07-12 ENCOUNTER — Other Ambulatory Visit: Payer: Self-pay

## 2021-08-13 ENCOUNTER — Other Ambulatory Visit: Payer: Self-pay

## 2021-08-13 ENCOUNTER — Ambulatory Visit: Payer: Self-pay | Attending: Nurse Practitioner | Admitting: Nurse Practitioner

## 2021-08-13 VITALS — BP 148/84 | HR 62 | Ht 71.0 in | Wt 216.0 lb

## 2021-08-13 DIAGNOSIS — M79604 Pain in right leg: Secondary | ICD-10-CM

## 2021-08-13 DIAGNOSIS — E785 Hyperlipidemia, unspecified: Secondary | ICD-10-CM

## 2021-08-13 DIAGNOSIS — G8929 Other chronic pain: Secondary | ICD-10-CM

## 2021-08-13 DIAGNOSIS — R7303 Prediabetes: Secondary | ICD-10-CM

## 2021-08-13 DIAGNOSIS — M79605 Pain in left leg: Secondary | ICD-10-CM

## 2021-08-13 DIAGNOSIS — I10 Essential (primary) hypertension: Secondary | ICD-10-CM

## 2021-08-13 DIAGNOSIS — Z0283 Encounter for blood-alcohol and blood-drug test: Secondary | ICD-10-CM

## 2021-08-13 DIAGNOSIS — M2550 Pain in unspecified joint: Secondary | ICD-10-CM

## 2021-08-13 DIAGNOSIS — Z7189 Other specified counseling: Secondary | ICD-10-CM

## 2021-08-13 MED ORDER — VALSARTAN-HYDROCHLOROTHIAZIDE 160-25 MG PO TABS
1.0000 | ORAL_TABLET | Freq: Every day | ORAL | 3 refills | Status: DC
  Filled 2021-08-13: qty 30, 30d supply, fill #0
  Filled 2021-09-13: qty 30, 30d supply, fill #1
  Filled 2021-10-21: qty 30, 30d supply, fill #2
  Filled 2021-11-22: qty 30, 30d supply, fill #3
  Filled 2022-01-05: qty 30, 30d supply, fill #4
  Filled 2022-02-01: qty 30, 30d supply, fill #5

## 2021-08-13 MED ORDER — ATORVASTATIN CALCIUM 20 MG PO TABS
20.0000 mg | ORAL_TABLET | Freq: Every day | ORAL | 3 refills | Status: DC
  Filled 2021-08-13: qty 30, 30d supply, fill #0
  Filled 2021-09-13: qty 30, 30d supply, fill #1
  Filled 2021-10-21: qty 30, 30d supply, fill #2
  Filled 2021-11-22: qty 30, 30d supply, fill #3
  Filled 2022-01-05: qty 30, 30d supply, fill #4

## 2021-08-13 MED ORDER — AMLODIPINE BESYLATE 10 MG PO TABS
10.0000 mg | ORAL_TABLET | Freq: Every day | ORAL | 1 refills | Status: DC
  Filled 2021-08-13: qty 30, 30d supply, fill #0
  Filled 2021-09-13: qty 30, 30d supply, fill #1
  Filled 2021-10-21: qty 30, 30d supply, fill #2
  Filled 2021-11-22: qty 30, 30d supply, fill #3

## 2021-08-13 MED ORDER — TRAMADOL HCL 50 MG PO TABS
50.0000 mg | ORAL_TABLET | Freq: Three times a day (TID) | ORAL | 3 refills | Status: DC | PRN
  Filled 2021-08-13: qty 60, 20d supply, fill #0
  Filled 2021-09-13: qty 60, 20d supply, fill #1
  Filled 2021-10-21: qty 60, 20d supply, fill #2
  Filled 2021-11-22: qty 60, 20d supply, fill #3

## 2021-08-13 NOTE — Progress Notes (Signed)
Assessment & Plan:  Alton was seen today for hypertension.  Diagnoses and all orders for this visit:  Primary hypertension -     CMP14+EGFR -     amLODipine (NORVASC) 10 MG tablet; Take 1 tablet (10 mg total) by mouth daily. -     valsartan-hydrochlorothiazide (DIOVAN-HCT) 160-25 MG tablet; Take 1 tablet by mouth daily. NEEDS PASS  Encounter for drug screening -     Drug Screen 13 w/Conf, WB -     traMADol (ULTRAM) 50 MG tablet; Take 1 tablet (50 mg total) by mouth every 8 (eight) hours as needed.  Prediabetes -     Hemoglobin A1c  Dyslipidemia, goal LDL below 70 -     Lipid panel -     atorvastatin (LIPITOR) 20 MG tablet; Take 1 tablet (20 mg total) by mouth daily.  Chronic pain of both lower extremities -     traMADol (ULTRAM) 50 MG tablet; Take 1 tablet (50 mg total) by mouth every 8 (eight) hours as needed.    Patient has been counseled on age-appropriate routine health concerns for screening and prevention. These are reviewed and up-to-date. Referrals have been placed accordingly. Immunizations are up-to-date or declined.    Subjective:   Chief Complaint  Patient presents with   Hypertension   HPI CIEL Randall Ferrell 56 y.o. male presents to office today for HTN and bilateral leg pain. Previous ABI normal bilaterally (12-2019).  He has a past medical history of Heart murmur, Hyperlipidemia, Hypertension, Incomplete right bundle branch block, and Left hydrocele.   HTN Blood pressure is elevated today. Will switch hyzaar to Diovan-HCT today. He will continue on amlodipine 10 mg daily. Has not been monitoring blood pressure at home as he does not have a device.  BP Readings from Last 3 Encounters:  08/13/21 (!) 148/84  03/18/21 (!) 156/82  03/01/21 (!) 167/99    Leg pain Onset a few years ago. Pain is located in BLE and is described as soreness, aching and sharp, and is intermittent .   Associated symptoms include: none.  The patient has tried NSAIDs for pain, with  transient relief.  Related to injury:no. Pain is worse in the am upon awakening then subsides as the day progresses. He is a smoker. ABI normal. Pain is mostly present in the back of the hamstrings and posterior knees. Described as tight and aching sensation. Gabapentin 100 mg TID, meloxicam 15 mg, zanaflex 8m, methocarbamol 500 mg, cyclobenzaprine 5 mg all ineffective.     Review of Systems  Constitutional:  Negative for fever, malaise/fatigue and weight loss.  HENT: Negative.  Negative for nosebleeds.   Eyes: Negative.  Negative for blurred vision, double vision and photophobia.  Respiratory: Negative.  Negative for cough and shortness of breath.   Cardiovascular: Negative.  Negative for chest pain, palpitations and leg swelling.  Gastrointestinal: Negative.  Negative for heartburn, nausea and vomiting.  Musculoskeletal:  Positive for joint pain and myalgias.  Neurological: Negative.  Negative for dizziness, focal weakness, seizures and headaches.  Psychiatric/Behavioral: Negative.  Negative for suicidal ideas.    Past Medical History:  Diagnosis Date   AAA (abdominal aortic aneurysm) ([facility]able per duplex at dr gEinar Gipoffice 10-29-2014   moderate distal aorta  w/ bilateral iliac artery involvement 2.45cm X 3.43cm/  involves bilateral iliac vessels 129m  Heart murmur    Hyperlipidemia    Hypertension    Incomplete right bundle branch block    Left hydrocele     Past  Surgical History:  Procedure Laterality Date   CARDIOVASCULAR STRESS TEST  09-22-2014  dr Einar Gip   normal perfusion study/  normal LV function and wall motion , ef 62%   HYDROCELE EXCISION Left 02/02/2015   Procedure: HYDROCELECTOMY ADULT;  Surgeon: Nickie Retort, MD;  Location: [facility];  Service: Urology;  Laterality: Left;   TRANSTHORACIC ECHOCARDIOGRAM  10-29-2014   dr Einar Gip   moderate concentric LVH, ef 60%/  mild AR, MR, and TR/  IVC borderline dilated w/ respiratory variation    Family History   Problem Relation Age of Onset   Heart disease Father    Heart disease Brother     Social History Reviewed with no changes to be made today.   Outpatient Medications Prior to Visit  Medication Sig Dispense Refill   fluticasone (FLONASE) 50 MCG/ACT nasal spray Place 2 sprays into both nostrils daily. 16 g 6   terbinafine (LAMISIL AT) 1 % cream Apply 1 application topically 2 (two) times daily. 100 g 0   amLODipine (NORVASC) 10 MG tablet Take 1 tablet (10 mg total) by mouth daily. 90 tablet 1   atorvastatin (LIPITOR) 20 MG tablet Take 1 tablet (20 mg total) by mouth daily. 90 tablet 3   meloxicam (MOBIC) 15 MG tablet Take 1 tablet (15 mg total) by mouth daily. 90 tablet 0   tiZANidine (ZANAFLEX) 4 MG tablet Take 1 tablet (4 mg total) by mouth every 6 (six) hours as needed for muscle spasms. Or leg pain 60 tablet 3   valsartan-hydrochlorothiazide (DIOVAN-HCT) 160-25 MG tablet Take 1 tablet by mouth daily. 90 tablet 3   methocarbamol (ROBAXIN) 500 MG tablet Take 1 tablet (500 mg total) by mouth 4 (four) times daily. (Patient not taking: Reported on 08/13/2021) 60 tablet 0   No facility-administered medications prior to visit.    No Known Allergies     Objective:    BP (!) 148/84   Pulse 62   Ht '5\' 11"'  (1.803 m)   Wt 216 lb (98 kg)   SpO2 100%   BMI 30.13 kg/m  Wt Readings from Last 3 Encounters:  08/13/21 216 lb (98 kg)  03/01/21 220 lb 9.6 oz (100.1 kg)  08/28/20 219 lb (99.3 kg)    Physical Exam Vitals and nursing note reviewed.  Constitutional:      Appearance: He is well-developed.  HENT:     Head: Normocephalic and atraumatic.  Cardiovascular:     Rate and Rhythm: Normal rate and regular rhythm.     Heart sounds: Normal heart sounds. No murmur heard.   No friction rub. No gallop.  Pulmonary:     Effort: Pulmonary effort is normal. No tachypnea or respiratory distress.     Breath sounds: Normal breath sounds. No decreased breath sounds, wheezing, rhonchi or rales.   Chest:     Chest wall: No tenderness.  Abdominal:     General: Bowel sounds are normal.     Palpations: Abdomen is soft.  Musculoskeletal:        General: No swelling, tenderness, deformity or signs of injury. Normal range of motion.     Cervical back: Normal range of motion.     Right lower leg: No edema.     Left lower leg: No edema.  Skin:    General: Skin is warm and dry.  Neurological:     Mental Status: He is alert and oriented to person, place, and time.     Coordination: Coordination normal.  Psychiatric:        Behavior: Behavior normal. Behavior is cooperative.        Thought Content: Thought content normal.        Judgment: Judgment normal.         Patient has been counseled extensively about nutrition and exercise as well as the importance of adherence with medications and regular follow-up. The patient was given clear instructions to go to ER or return to medical center if symptoms don't improve, worsen or new problems develop. The patient verbalized understanding.   Follow-up: Return for 3 weeks BP Check luke. See me in .   Gildardo Pounds, FNP-BC [facility] and [facility] [place], [place]   08/16/2021, 8:54 AM

## 2021-08-16 ENCOUNTER — Encounter: Payer: Self-pay | Admitting: Nurse Practitioner

## 2021-08-16 ENCOUNTER — Other Ambulatory Visit: Payer: Self-pay

## 2021-08-30 LAB — CMP14+EGFR
ALT: 37 IU/L (ref 0–44)
AST: 20 IU/L (ref 0–40)
Albumin/Globulin Ratio: 1.5 (ref 1.2–2.2)
Albumin: 4.8 g/dL (ref 3.8–4.9)
Alkaline Phosphatase: 103 IU/L (ref 44–121)
BUN/Creatinine Ratio: 11 (ref 9–20)
BUN: 12 mg/dL (ref 6–24)
Bilirubin Total: 0.9 mg/dL (ref 0.0–1.2)
CO2: 26 mmol/L (ref 20–29)
Calcium: 10.2 mg/dL (ref 8.7–10.2)
Chloride: 103 mmol/L (ref 96–106)
Creatinine, Ser: 1.08 mg/dL (ref 0.76–1.27)
Globulin, Total: 3.2 g/dL (ref 1.5–4.5)
Glucose: 98 mg/dL (ref 70–99)
Potassium: 4.3 mmol/L (ref 3.5–5.2)
Sodium: 141 mmol/L (ref 134–144)
Total Protein: 8 g/dL (ref 6.0–8.5)
eGFR: 80 mL/min/{1.73_m2} (ref >59)

## 2021-08-30 LAB — COCAINE,MS,WB/SP RFX
Benzoylecgonine: 103 ng/mL
Cocaine Confirmation: POSITIVE
Cocaine: NEGATIVE ng/mL

## 2021-08-30 LAB — LIPID PANEL
Chol/HDL Ratio: 2.7 ratio (ref 0.0–5.0)
Cholesterol, Total: 109 mg/dL (ref 100–199)
HDL: 40 mg/dL (ref >39)
LDL Chol Calc (NIH): 55 mg/dL (ref 0–99)
Triglycerides: 64 mg/dL (ref 0–149)
VLDL Cholesterol Cal: 14 mg/dL (ref 5–40)

## 2021-08-30 LAB — DRUG SCREEN 13 W/CONF, WB
Amphetamines, IA: NEGATIVE ng/mL
Barbiturates, IA: NEGATIVE ug/mL
Benzodiazepines, IA: NEGATIVE ng/mL
Cocaine/Metabolite, IA: POSITIVE ng/mL — AB
Fentanyl, IA: NEGATIVE ng/mL
Meperidine, IA: NEGATIVE ng/mL
Methadone, IA: NEGATIVE ng/mL
Opiates, IA: NEGATIVE ng/mL
Oxycodones, IA: NEGATIVE ng/mL
Phencyclidine, IA: NEGATIVE ng/mL
Propoxyphene, IA: NEGATIVE ng/mL
THC (Marijuana) Mtb, IA: NEGATIVE ng/mL
Tramadol, IA: NEGATIVE ng/mL

## 2021-08-30 LAB — HEMOGLOBIN A1C
Est. average glucose Bld gHb Est-mCnc: 123 mg/dL
Hgb A1c MFr Bld: 5.9 % — ABNORMAL HIGH (ref 4.8–5.6)

## 2021-09-13 ENCOUNTER — Other Ambulatory Visit: Payer: Self-pay

## 2021-09-14 NOTE — Progress Notes (Unsigned)
   S:    Randall Ferrell is a 56 y.o. male who presents for hypertension evaluation, education, and management. PMH is significant for HTN. Patient was referred and last seen by Primary Care Provider, Bertram Denver, on 08/13/21 when BP was 148/84 and losartan-HCTZ was switched to valsartan-HCTZ.   Today, patient arrives in *** spirits and presents {w-w/o:315700} assistance. *** Denies dizziness, headache, blurred vision, swelling.  ***bmet? Increase valsartan to 320?  Patient reports hypertension was diagnosed in ***.   Family/Social history: Current smoker  Medication adherence *** . Patient has *** taken BP medications today.   Current antihypertensives include: amlodipine 10 mg daily, valsartan-HCTZ 160-25 mg daily  Antihypertensives tried in the past include: ***  Reported home BP readings: ***  Patient reported dietary habits: Eats *** meals/day Breakfast: *** Lunch: *** Dinner: *** Snacks: *** Drinks: ***  Patient-reported exercise habits: ***  ASCVD risk factors include: ***  O:   Last 3 Office BP readings: BP Readings from Last 3 Encounters:  08/13/21 (!) 148/84  03/18/21 (!) 156/82  03/01/21 (!) 167/99   BMET    Component Value Date/Time   NA 141 08/13/2021 1608   K 4.3 08/13/2021 1608   CL 103 08/13/2021 1608   CO2 26 08/13/2021 1608   GLUCOSE 98 08/13/2021 1608   GLUCOSE 123 (H) 04/02/2017 1523   BUN 12 08/13/2021 1608   CREATININE 1.08 08/13/2021 1608   CREATININE 0.87 09/14/2014 1020   CALCIUM 10.2 08/13/2021 1608   GFRNONAA 88 04/24/2020 1607   GFRAA 102 04/24/2020 1607   Renal function: CrCl cannot be calculated (Patient's most recent lab result is older than the maximum 21 days allowed.).  Clinical ASCVD: No  The ASCVD Risk score (Arnett DK, et al., 2019) failed to calculate for the following reasons:   The valid total cholesterol range is 130 to 320 mg/dL  A/P: Hypertension diagnosed *** currently *** on current medications. BP goal <  130/80 *** mmHg. Medication adherence appears ***. Control is suboptimal due to ***.  -{Meds adjust:18428} ***.  -Patient educated on purpose, proper use, and potential adverse effects of ***.  -F/u labs ordered - *** -Counseled on lifestyle modifications for blood pressure control including reduced dietary sodium, increased exercise, adequate sleep. -Encouraged patient to check BP at home and bring log of readings to next visit. Counseled on proper use of home BP cuff.   Results reviewed and written information provided. Patient verbalized understanding of treatment plan. Total time in face-to-face counseling *** minutes.   F/u clinic visit in ***. Patient seen with ***.

## 2021-09-15 ENCOUNTER — Other Ambulatory Visit: Payer: Self-pay

## 2021-09-20 ENCOUNTER — Ambulatory Visit: Payer: Self-pay | Attending: Nurse Practitioner | Admitting: Pharmacist

## 2021-09-20 ENCOUNTER — Encounter: Payer: Self-pay | Admitting: Pharmacist

## 2021-09-20 VITALS — BP 126/74

## 2021-09-20 DIAGNOSIS — I10 Essential (primary) hypertension: Secondary | ICD-10-CM

## 2021-10-21 ENCOUNTER — Other Ambulatory Visit: Payer: Self-pay

## 2021-10-26 ENCOUNTER — Other Ambulatory Visit: Payer: Self-pay

## 2021-11-19 ENCOUNTER — Encounter: Payer: Self-pay | Admitting: Nurse Practitioner

## 2021-11-19 ENCOUNTER — Ambulatory Visit: Payer: Self-pay | Attending: Nurse Practitioner | Admitting: Nurse Practitioner

## 2021-11-19 VITALS — BP 164/95 | HR 58 | Ht 71.0 in | Wt 203.6 lb

## 2021-11-19 DIAGNOSIS — I10 Essential (primary) hypertension: Secondary | ICD-10-CM

## 2021-11-19 NOTE — Progress Notes (Unsigned)
Assessment & Plan:  Randall Ferrell was seen today for hypertension and medication refill.  Diagnoses and all orders for this visit:  Primary hypertension -     amLODipine (NORVASC) 10 MG tablet; Take 1 tablet (10 mg total) by mouth daily. Continue amlodipine  and diovan-hct as prescribed.  Reminded to bring in blood pressure log for follow  up appointment.  RECOMMENDATIONS: DASH/Mediterranean Diets are healthier choices for HTN.     Patient has been counseled on age-appropriate routine health concerns for screening and prevention. These are reviewed and up-to-date. Referrals have been placed accordingly. Immunizations are up-to-date or declined.    Subjective:   Chief Complaint  Patient presents with   Hypertension   Medication Refill   HPI Randall Ferrell 56 y.o. male presents to office today for HTN  He has a past medical history of Heart murmur, Hyperlipidemia, Hypertension, Incomplete right bundle branch block, and Left hydrocele.     HTN Blood pressure is elevated. He smoked a cigarette prior to his office visit today. Currently taking amlodipine 10 mg daily and diovan-hct  160-25 mg daily. Has not been monitoring blood pressure at home as he does not have a device. If BP continues elevated will need to increase valsartan 160-25 mg daily.  BP Readings from Last 3 Encounters:  11/19/21 (!) 164/95  09/20/21 126/74  08/13/21 (!) 148/84      Review of Systems  Constitutional:  Negative for fever, malaise/fatigue and weight loss.  HENT: Negative.  Negative for nosebleeds.   Eyes: Negative.  Negative for blurred vision, double vision and photophobia.  Respiratory: Negative.  Negative for cough and shortness of breath.   Cardiovascular: Negative.  Negative for chest pain, palpitations and leg swelling.  Gastrointestinal: Negative.  Negative for heartburn, nausea and vomiting.  Musculoskeletal: Negative.  Negative for myalgias.  Neurological: Negative.  Negative for dizziness,  focal weakness, seizures and headaches.  Psychiatric/Behavioral: Negative.  Negative for suicidal ideas.     Past Medical History:  Diagnosis Date   AAA (abdominal aortic aneurysm) (HCC) per duplex at dr Jacinto Halim office 10-29-2014   moderate distal aorta  w/ bilateral iliac artery involvement 2.45cm X 3.43cm/  involves bilateral iliac vessels 16mm   Heart murmur    Hyperlipidemia    Hypertension    Incomplete right bundle branch block    Left hydrocele     Past Surgical History:  Procedure Laterality Date   CARDIOVASCULAR STRESS TEST  09-22-2014  dr Jacinto Halim   normal perfusion study/  normal LV function and wall motion , ef 62%   HYDROCELE EXCISION Left 02/02/2015   Procedure: HYDROCELECTOMY ADULT;  Surgeon: Hildred Laser, MD;  Location: [facility];  Service: Urology;  Laterality: Left;   TRANSTHORACIC ECHOCARDIOGRAM  10-29-2014   dr Jacinto Halim   moderate concentric LVH, ef 60%/  mild AR, MR, and TR/  IVC borderline dilated w/ respiratory variation    Family History  Problem Relation Age of Onset   Heart disease Father    Heart disease Brother     Social History Reviewed with no changes to be made today.   Outpatient Medications Prior to Visit  Medication Sig Dispense Refill   atorvastatin (LIPITOR) 20 MG tablet Take 1 tablet (20 mg total) by mouth daily. 90 tablet 3   fluticasone (FLONASE) 50 MCG/ACT nasal spray Place 2 sprays into both nostrils daily. 16 g 6   terbinafine (LAMISIL AT) 1 % cream Apply 1 application topically 2 (two) times daily.  100 g 0   traMADol (ULTRAM) 50 MG tablet Take 1 tablet (50 mg total) by mouth every 8 (eight) hours as needed. 60 tablet 3   valsartan-hydrochlorothiazide (DIOVAN-HCT) 160-25 MG tablet Take 1 tablet by mouth daily. NEEDS PASS 90 tablet 3   amLODipine (NORVASC) 10 MG tablet Take 1 tablet (10 mg total) by mouth daily. 90 tablet 1   No facility-administered medications prior to visit.    No Known Allergies     Objective:     BP (!) 164/95   Pulse (!) 58   Ht 5\' 11"  (1.803 m)   Wt 203 lb 9.6 oz (92.4 kg)   SpO2 99%   BMI 28.40 kg/m  Wt Readings from Last 3 Encounters:  11/19/21 203 lb 9.6 oz (92.4 kg)  08/13/21 216 lb (98 kg)  03/01/21 220 lb 9.6 oz (100.1 kg)    Physical Exam Vitals and nursing note reviewed.  Constitutional:      Appearance: He is well-developed.  HENT:     Head: Normocephalic and atraumatic.  Cardiovascular:     Rate and Rhythm: Regular rhythm. Bradycardia present.     Heart sounds: Normal heart sounds. No murmur heard.    No friction rub. No gallop.  Pulmonary:     Effort: Pulmonary effort is normal. No tachypnea or respiratory distress.     Breath sounds: Normal breath sounds. No decreased breath sounds, wheezing, rhonchi or rales.  Chest:     Chest wall: No tenderness.  Abdominal:     General: Bowel sounds are normal.     Palpations: Abdomen is soft.  Musculoskeletal:        General: Normal range of motion.     Cervical back: Normal range of motion.  Skin:    General: Skin is warm and dry.  Neurological:     Mental Status: He is alert and oriented to person, place, and time.     Coordination: Coordination normal.  Psychiatric:        Behavior: Behavior normal. Behavior is cooperative.        Thought Content: Thought content normal.        Judgment: Judgment normal.          Patient has been counseled extensively about nutrition and exercise as well as the importance of adherence with medications and regular follow-up. The patient was given clear instructions to go to ER or return to medical center if symptoms don't improve, worsen or new problems develop. The patient verbalized understanding.   Follow-up: Return in about 3 months (around 02/19/2022) for a1c/htn.   02/21/2022, FNP-BC [facility] and [facility], [place]   11/24/2021, 10:21 PM

## 2021-11-22 ENCOUNTER — Other Ambulatory Visit: Payer: Self-pay

## 2021-11-24 ENCOUNTER — Encounter: Payer: Self-pay | Admitting: Nurse Practitioner

## 2021-11-24 MED ORDER — AMLODIPINE BESYLATE 10 MG PO TABS
10.0000 mg | ORAL_TABLET | Freq: Every day | ORAL | 1 refills | Status: DC
  Filled 2022-01-05: qty 30, 30d supply, fill #0
  Filled 2022-02-01: qty 30, 30d supply, fill #1

## 2021-11-25 ENCOUNTER — Other Ambulatory Visit: Payer: Self-pay

## 2021-11-26 ENCOUNTER — Other Ambulatory Visit: Payer: Self-pay

## 2022-01-05 ENCOUNTER — Other Ambulatory Visit (HOSPITAL_BASED_OUTPATIENT_CLINIC_OR_DEPARTMENT_OTHER): Payer: Self-pay

## 2022-01-05 ENCOUNTER — Other Ambulatory Visit: Payer: Self-pay

## 2022-01-05 ENCOUNTER — Other Ambulatory Visit: Payer: Self-pay | Admitting: Nurse Practitioner

## 2022-01-05 DIAGNOSIS — Z0283 Encounter for blood-alcohol and blood-drug test: Secondary | ICD-10-CM

## 2022-01-05 DIAGNOSIS — G8929 Other chronic pain: Secondary | ICD-10-CM

## 2022-01-10 ENCOUNTER — Other Ambulatory Visit: Payer: Self-pay

## 2022-01-13 ENCOUNTER — Other Ambulatory Visit: Payer: Self-pay

## 2022-02-02 ENCOUNTER — Other Ambulatory Visit: Payer: Self-pay

## 2022-02-03 ENCOUNTER — Other Ambulatory Visit: Payer: Self-pay

## 2022-02-25 ENCOUNTER — Other Ambulatory Visit: Payer: Self-pay

## 2022-02-25 ENCOUNTER — Encounter: Payer: Self-pay | Admitting: Nurse Practitioner

## 2022-02-25 ENCOUNTER — Ambulatory Visit: Payer: Self-pay | Attending: Nurse Practitioner | Admitting: Nurse Practitioner

## 2022-02-25 VITALS — BP 167/90 | HR 61 | Wt 213.2 lb

## 2022-02-25 DIAGNOSIS — I10 Essential (primary) hypertension: Secondary | ICD-10-CM

## 2022-02-25 DIAGNOSIS — G8929 Other chronic pain: Secondary | ICD-10-CM

## 2022-02-25 DIAGNOSIS — M79604 Pain in right leg: Secondary | ICD-10-CM

## 2022-02-25 DIAGNOSIS — M79605 Pain in left leg: Secondary | ICD-10-CM

## 2022-02-25 MED ORDER — GABAPENTIN 600 MG PO TABS
300.0000 mg | ORAL_TABLET | Freq: Every day | ORAL | 0 refills | Status: DC
  Filled 2022-02-25: qty 60, 60d supply, fill #0

## 2022-02-25 MED ORDER — VALSARTAN-HYDROCHLOROTHIAZIDE 320-25 MG PO TABS
1.0000 | ORAL_TABLET | Freq: Every day | ORAL | 3 refills | Status: DC
  Filled 2022-02-25: qty 30, 30d supply, fill #0

## 2022-02-25 NOTE — Progress Notes (Signed)
Assessment & Plan:  Randall Ferrell was seen today for hypertension.  Diagnoses and all orders for this visit:  Primary hypertension -     valsartan-hydrochlorothiazide (DIOVAN-HCT) 320-25 MG tablet; Take 1 tablet by mouth daily. Continue all antihypertensives as prescribed.  Reminded to bring in blood pressure log for follow  up appointment.  RECOMMENDATIONS: DASH/Mediterranean Diets are healthier choices for HTN.    Chronic pain of both lower extremities -     gabapentin (NEURONTIN) 600 MG tablet; Take 0.5-1 tablets (300-600 mg total) by mouth daily.    Patient has been counseled on age-appropriate routine health concerns for screening and prevention. These are reviewed and up-to-date. Referrals have been placed accordingly. Immunizations are up-to-date or declined.    Subjective:   Chief Complaint  Patient presents with   Hypertension   HPI Randall Ferrell 56 y.o. male presents to office today for follow up to HTN  He has a past medical history of Heart murmur, Hyperlipidemia, Hypertension, Incomplete right bundle branch block, and Left hydrocele.    HTN Blood pressure is elevated today. Will stop amlodipine and diovan-HCT 160-25 mg daily and start Diovan-HCT 320-25 mg daily.  BP Readings from Last 3 Encounters:  02/25/22 (!) 167/90  11/19/21 (!) 164/95  09/20/21 126/74    Arthralgias Continues with intermittent joint pain. Toxicology positive so we had to stop tramadol. Pain is unrelated to any trauma or injury. His job does require extensive manual labor. Pain mostly in BLE. ABI normal.     Review of Systems  Constitutional:  Negative for fever, malaise/fatigue and weight loss.  HENT: Negative.  Negative for nosebleeds.   Eyes: Negative.  Negative for blurred vision, double vision and photophobia.  Respiratory: Negative.  Negative for cough and shortness of breath.   Cardiovascular: Negative.  Negative for chest pain, palpitations and leg swelling.  Gastrointestinal:  Negative.  Negative for heartburn, nausea and vomiting.  Musculoskeletal:  Positive for joint pain. Negative for myalgias.  Neurological: Negative.  Negative for dizziness, focal weakness, seizures and headaches.  Psychiatric/Behavioral: Negative.  Negative for suicidal ideas.     Past Medical History:  Diagnosis Date   AAA (abdominal aortic aneurysm) (HCC) per duplex at dr Jacinto Halim office 10-29-2014   moderate distal aorta  w/ bilateral iliac artery involvement 2.45cm X 3.43cm/  involves bilateral iliac vessels 63mm   Heart murmur    Hyperlipidemia    Hypertension    Incomplete right bundle branch block    Left hydrocele     Past Surgical History:  Procedure Laterality Date   CARDIOVASCULAR STRESS TEST  09-22-2014  dr Jacinto Halim   normal perfusion study/  normal LV function and wall motion , ef 62%   HYDROCELE EXCISION Left 02/02/2015   Procedure: HYDROCELECTOMY ADULT;  Surgeon: Hildred Laser, MD;  Location: [facility];  Service: Urology;  Laterality: Left;   TRANSTHORACIC ECHOCARDIOGRAM  10-29-2014   dr Jacinto Halim   moderate concentric LVH, ef 60%/  mild AR, MR, and TR/  IVC borderline dilated w/ respiratory variation    Family History  Problem Relation Age of Onset   Heart disease Father    Heart disease Brother     Social History Reviewed with no changes to be made today.   Outpatient Medications Prior to Visit  Medication Sig Dispense Refill   fluticasone (FLONASE) 50 MCG/ACT nasal spray Place 2 sprays into both nostrils daily. 16 g 6   terbinafine (LAMISIL AT) 1 % cream Apply 1 application topically  2 (two) times daily. 100 g 0   amLODipine (NORVASC) 10 MG tablet Take 1 tablet (10 mg total) by mouth daily. 90 tablet 1   valsartan-hydrochlorothiazide (DIOVAN-HCT) 160-25 MG tablet Take 1 tablet by mouth daily. NEEDS PASS 90 tablet 3   atorvastatin (LIPITOR) 20 MG tablet Take 1 tablet (20 mg total) by mouth daily. (Patient not taking: Reported on 02/25/2022) 90  tablet 3   traMADol (ULTRAM) 50 MG tablet Take 1 tablet (50 mg total) by mouth every 8 (eight) hours as needed. (Patient not taking: Reported on 02/25/2022) 60 tablet 3   No facility-administered medications prior to visit.    No Known Allergies     Objective:    BP (!) 167/90   Pulse 61   Wt 213 lb 3.2 oz (96.7 kg)   SpO2 99%   BMI 29.74 kg/m  Wt Readings from Last 3 Encounters:  02/25/22 213 lb 3.2 oz (96.7 kg)  11/19/21 203 lb 9.6 oz (92.4 kg)  08/13/21 216 lb (98 kg)    Physical Exam Vitals and nursing note reviewed.  Constitutional:      Appearance: He is well-developed.  HENT:     Head: Normocephalic and atraumatic.  Cardiovascular:     Rate and Rhythm: Normal rate and regular rhythm.     Heart sounds: Normal heart sounds. No murmur heard.    No friction rub. No gallop.  Pulmonary:     Effort: Pulmonary effort is normal. No tachypnea or respiratory distress.     Breath sounds: Normal breath sounds. No decreased breath sounds, wheezing, rhonchi or rales.  Chest:     Chest wall: No tenderness.  Abdominal:     General: Bowel sounds are normal.     Palpations: Abdomen is soft.  Musculoskeletal:        General: Normal range of motion.     Cervical back: Normal range of motion.  Skin:    General: Skin is warm and dry.  Neurological:     Mental Status: He is alert and oriented to person, place, and time.     Coordination: Coordination normal.  Psychiatric:        Behavior: Behavior normal. Behavior is cooperative.        Thought Content: Thought content normal.        Judgment: Judgment normal.          Patient has been counseled extensively about nutrition and exercise as well as the importance of adherence with medications and regular follow-up. The patient was given clear instructions to go to ER or return to medical center if symptoms don't improve, worsen or new problems develop. The patient verbalized understanding.   Follow-up: Return in about 5 weeks  (around 04/01/2022) for BP CHECK LABS with me double book 410.   Claiborne Rigg, FNP-BC [facility] [place], [place] [phone]   02/25/2022, 8:53 PM

## 2022-02-25 NOTE — Progress Notes (Signed)
Patient is requesting tylenol 3 since he is not getting refills on tramadol

## 2022-03-03 ENCOUNTER — Other Ambulatory Visit: Payer: Self-pay

## 2022-04-01 ENCOUNTER — Encounter: Payer: Self-pay | Admitting: Nurse Practitioner

## 2022-04-01 ENCOUNTER — Other Ambulatory Visit: Payer: Self-pay

## 2022-04-01 ENCOUNTER — Ambulatory Visit: Payer: Self-pay | Attending: Nurse Practitioner | Admitting: Nurse Practitioner

## 2022-04-01 VITALS — BP 158/93 | HR 61 | Ht 71.0 in | Wt 211.6 lb

## 2022-04-01 DIAGNOSIS — R7303 Prediabetes: Secondary | ICD-10-CM

## 2022-04-01 DIAGNOSIS — M79604 Pain in right leg: Secondary | ICD-10-CM

## 2022-04-01 DIAGNOSIS — E785 Hyperlipidemia, unspecified: Secondary | ICD-10-CM

## 2022-04-01 DIAGNOSIS — I10 Essential (primary) hypertension: Secondary | ICD-10-CM

## 2022-04-01 DIAGNOSIS — M79605 Pain in left leg: Secondary | ICD-10-CM

## 2022-04-01 DIAGNOSIS — Z8679 Personal history of other diseases of the circulatory system: Secondary | ICD-10-CM

## 2022-04-01 DIAGNOSIS — G8929 Other chronic pain: Secondary | ICD-10-CM

## 2022-04-01 MED ORDER — AMLODIPINE BESYLATE 10 MG PO TABS
10.0000 mg | ORAL_TABLET | Freq: Every day | ORAL | 1 refills | Status: DC
  Filled 2022-04-01: qty 30, 30d supply, fill #0
  Filled 2022-05-05 – 2022-05-06 (×2): qty 30, 30d supply, fill #1
  Filled 2022-06-02: qty 30, 30d supply, fill #2
  Filled 2022-07-03: qty 90, 90d supply, fill #3

## 2022-04-01 MED ORDER — VALSARTAN-HYDROCHLOROTHIAZIDE 320-25 MG PO TABS
1.0000 | ORAL_TABLET | Freq: Every day | ORAL | 3 refills | Status: DC
  Filled 2022-04-01: qty 30, 30d supply, fill #0
  Filled 2022-05-05 – 2022-05-06 (×2): qty 30, 30d supply, fill #1
  Filled 2022-06-02: qty 30, 30d supply, fill #2
  Filled 2022-07-03: qty 30, 30d supply, fill #3
  Filled 2022-07-31 – 2022-08-05 (×2): qty 30, 30d supply, fill #4
  Filled 2022-09-01 (×2): qty 30, 30d supply, fill #5
  Filled 2022-10-04 – 2022-10-05 (×2): qty 30, 30d supply, fill #6
  Filled 2022-11-04: qty 30, 30d supply, fill #7
  Filled 2022-11-07: qty 31, 31d supply, fill #7
  Filled 2022-12-20: qty 31, 31d supply, fill #8
  Filled 2022-12-21: qty 30, 30d supply, fill #8
  Filled 2023-01-22 – 2023-01-23 (×2): qty 30, 30d supply, fill #9

## 2022-04-01 MED ORDER — GABAPENTIN 600 MG PO TABS
600.0000 mg | ORAL_TABLET | Freq: Every day | ORAL | 1 refills | Status: DC
  Filled 2022-04-01 – 2022-07-31 (×2): qty 90, 90d supply, fill #0
  Filled 2022-10-04 – 2022-11-04 (×2): qty 90, 90d supply, fill #1

## 2022-04-01 MED ORDER — ATORVASTATIN CALCIUM 20 MG PO TABS
20.0000 mg | ORAL_TABLET | Freq: Every day | ORAL | 3 refills | Status: DC
  Filled 2022-04-01: qty 30, 30d supply, fill #0
  Filled 2022-05-05 – 2022-05-06 (×2): qty 30, 30d supply, fill #1
  Filled 2022-06-02: qty 30, 30d supply, fill #2
  Filled 2022-07-03: qty 30, 30d supply, fill #3
  Filled 2022-07-04: qty 90, 90d supply, fill #3
  Filled 2022-10-04 – 2022-10-05 (×2): qty 90, 90d supply, fill #4
  Filled 2023-02-01 – 2023-02-03 (×2): qty 90, 90d supply, fill #5

## 2022-04-01 NOTE — Progress Notes (Signed)
Assessment & Plan:  Randall Ferrell was seen today for hypertension.  Diagnoses and all orders for this visit:  Primary hypertension Added amlodipine today.  Continue Diovan HCT.  Follow-up in 3 to 4 weeks for blood pressure check. -     amLODipine (NORVASC) 10 MG tablet; Take 1 tablet (10 mg total) by mouth daily. -     CMP14+EGFR -     valsartan-hydrochlorothiazide (DIOVAN-HCT) 320-25 MG tablet; Take 1 tablet by mouth daily.  Chronic pain of both lower extremities -     gabapentin (NEURONTIN) 600 MG tablet; Take 1 tablet (600 mg total) by mouth daily.  Dyslipidemia, goal LDL below 70 -     atorvastatin (LIPITOR) 20 MG tablet; Take 1 tablet (20 mg total) by mouth daily.  Prediabetes -     CMP14+EGFR -     Hemoglobin A1c    Patient has been counseled on age-appropriate routine health concerns for screening and prevention. These are reviewed and up-to-date. Referrals have been placed accordingly. Immunizations are up-to-date or declined.    Subjective:   Chief Complaint  Patient presents with   Hypertension   Hypertension Pertinent negatives include no blurred vision, chest pain, headaches, malaise/fatigue, palpitations or shortness of breath.   Randall Ferrell 57 y.o. male presents to office today for follow up to HTN  He has a past medical history of AAA (per duplex at dr Jacinto Halim office 10-29-2014), Heart murmur, Hyperlipidemia, Hypertension, Incomplete right bundle branch block, and Left hydrocele.   VAS [place] ordered today for follow up to AAA.  He had was scheduled 03/17/2021 however he canceled due to schedule conflict.  He was started on DIOVAN HCT 320-25 mg daily last month. Blood pressure continues elevated today despite him endorsing medication adherence. Will add amlodipine 10 mg today. He does not have a blood pressure monitor and I have strongly encouraged him to obtain one so we can evaluate his home readings.  BP Readings from Last 3 Encounters:  04/01/22 (!) 158/93   02/25/22 (!) 167/90  11/19/21 (!) 164/95    Review of Systems  Constitutional:  Negative for fever, malaise/fatigue and weight loss.  HENT: Negative.  Negative for nosebleeds.   Eyes: Negative.  Negative for blurred vision, double vision and photophobia.  Respiratory: Negative.  Negative for cough and shortness of breath.   Cardiovascular: Negative.  Negative for chest pain, palpitations and leg swelling.  Gastrointestinal: Negative.  Negative for heartburn, nausea and vomiting.  Musculoskeletal:  Positive for joint pain. Negative for myalgias.  Neurological:  Positive for sensory change. Negative for dizziness, focal weakness, seizures and headaches.  Psychiatric/Behavioral: Negative.  Negative for suicidal ideas.     Past Medical History:  Diagnosis Date   AAA (abdominal aortic aneurysm) (HCC) per duplex at dr Jacinto Halim office 10-29-2014   moderate distal aorta  w/ bilateral iliac artery involvement 2.45cm X 3.43cm/  involves bilateral iliac vessels 83mm   Heart murmur    Hyperlipidemia    Hypertension    Incomplete right bundle branch block    Left hydrocele     Past Surgical History:  Procedure Laterality Date   CARDIOVASCULAR STRESS TEST  09-22-2014  dr Jacinto Halim   normal perfusion study/  normal LV function and wall motion , ef 62%   HYDROCELE EXCISION Left 02/02/2015   Procedure: HYDROCELECTOMY ADULT;  Surgeon: Hildred Laser, MD;  Location: [facility];  Service: Urology;  Laterality: Left;   TRANSTHORACIC ECHOCARDIOGRAM  10-29-2014   dr Jacinto Halim  moderate concentric LVH, ef 60%/  mild AR, MR, and TR/  IVC borderline dilated w/ respiratory variation    Family History  Problem Relation Age of Onset   Heart disease Father    Heart disease Brother     Social History Reviewed with no changes to be made today.   Outpatient Medications Prior to Visit  Medication Sig Dispense Refill   fluticasone (FLONASE) 50 MCG/ACT nasal spray Place 2 sprays into both  nostrils daily. 16 g 6   terbinafine (LAMISIL AT) 1 % cream Apply 1 application topically 2 (two) times daily. 100 g 0   gabapentin (NEURONTIN) 600 MG tablet Take 0.5-1 tablets (300-600 mg total) by mouth daily. 60 tablet 0   traMADol (ULTRAM) 50 MG tablet Take 1 tablet (50 mg total) by mouth every 8 (eight) hours as needed. (Patient not taking: Reported on 02/25/2022) 60 tablet 3   atorvastatin (LIPITOR) 20 MG tablet Take 1 tablet (20 mg total) by mouth daily. (Patient not taking: Reported on 02/25/2022) 90 tablet 3   valsartan-hydrochlorothiazide (DIOVAN-HCT) 320-25 MG tablet Take 1 tablet by mouth daily. (Patient not taking: Reported on 04/01/2022) 90 tablet 3   No facility-administered medications prior to visit.    No Known Allergies     Objective:    BP (!) 158/93   Pulse 61   Ht 5\' 11"  (1.803 m)   Wt 211 lb 9.6 oz (96 kg)   SpO2 98%   BMI 29.51 kg/m  Wt Readings from Last 3 Encounters:  04/01/22 211 lb 9.6 oz (96 kg)  02/25/22 213 lb 3.2 oz (96.7 kg)  11/19/21 203 lb 9.6 oz (92.4 kg)    Physical Exam Vitals and nursing note reviewed.  Constitutional:      Appearance: He is well-developed.  HENT:     Head: Normocephalic and atraumatic.  Cardiovascular:     Rate and Rhythm: Normal rate and regular rhythm.     Heart sounds: Normal heart sounds. No murmur heard.    No friction rub. No gallop.  Pulmonary:     Effort: Pulmonary effort is normal. No tachypnea or respiratory distress.     Breath sounds: Normal breath sounds. No decreased breath sounds, wheezing, rhonchi or rales.  Chest:     Chest wall: No tenderness.  Abdominal:     General: Bowel sounds are normal.     Palpations: Abdomen is soft.  Musculoskeletal:        General: Normal range of motion.     Cervical back: Normal range of motion.  Skin:    General: Skin is warm and dry.  Neurological:     Mental Status: He is alert and oriented to person, place, and time.     Coordination: Coordination normal.   Psychiatric:        Behavior: Behavior normal. Behavior is cooperative.        Thought Content: Thought content normal.        Judgment: Judgment normal.          Patient has been counseled extensively about nutrition and exercise as well as the importance of adherence with medications and regular follow-up. The patient was given clear instructions to go to ER or return to medical center if symptoms don't improve, worsen or new problems develop. The patient verbalized understanding.   Follow-up: Return in about 4 weeks (around 04/29/2022) for BP CHECK 4 weeks luke.   Gildardo Pounds, FNP-BC [facility] [place], [place]  04/01/2022, 5:29 PM

## 2022-04-02 LAB — CMP14+EGFR
ALT: 15 IU/L (ref 0–44)
AST: 13 IU/L (ref 0–40)
Albumin/Globulin Ratio: 1.5 (ref 1.2–2.2)
Albumin: 4.5 g/dL (ref 3.8–4.9)
Alkaline Phosphatase: 82 IU/L (ref 44–121)
BUN/Creatinine Ratio: 16 (ref 9–20)
BUN: 15 mg/dL (ref 6–24)
Bilirubin Total: 0.8 mg/dL (ref 0.0–1.2)
CO2: 25 mmol/L (ref 20–29)
Calcium: 9.8 mg/dL (ref 8.7–10.2)
Chloride: 101 mmol/L (ref 96–106)
Creatinine, Ser: 0.96 mg/dL (ref 0.76–1.27)
Globulin, Total: 3 g/dL (ref 1.5–4.5)
Glucose: 71 mg/dL (ref 70–99)
Potassium: 3.9 mmol/L (ref 3.5–5.2)
Sodium: 138 mmol/L (ref 134–144)
Total Protein: 7.5 g/dL (ref 6.0–8.5)
eGFR: 92 mL/min/{1.73_m2} (ref >59)

## 2022-04-02 LAB — HEMOGLOBIN A1C
Est. average glucose Bld gHb Est-mCnc: 117 mg/dL
Hgb A1c MFr Bld: 5.7 % — ABNORMAL HIGH (ref 4.8–5.6)

## 2022-04-04 ENCOUNTER — Other Ambulatory Visit: Payer: Self-pay

## 2022-04-05 ENCOUNTER — Ambulatory Visit (HOSPITAL_COMMUNITY): Admission: RE | Admit: 2022-04-05 | Payer: Self-pay | Source: Ambulatory Visit

## 2022-04-05 NOTE — Progress Notes (Signed)
Unable to reach patient by phone x 3.

## 2022-04-06 ENCOUNTER — Other Ambulatory Visit: Payer: Self-pay

## 2022-04-06 ENCOUNTER — Telehealth: Payer: Self-pay | Admitting: Emergency Medicine

## 2022-04-06 ENCOUNTER — Telehealth: Payer: Self-pay | Admitting: Nurse Practitioner

## 2022-04-06 NOTE — Telephone Encounter (Signed)
Return call unanswered.  

## 2022-04-06 NOTE — Telephone Encounter (Signed)
Pt. Given lab results, verbalizes understanding. 

## 2022-04-06 NOTE — Telephone Encounter (Signed)
Copied from [place] [phone]. Topic: General - Other >> Apr 06, 2022 10:13 AM Everette C wrote: Reason for CRM: The patient hs returned a missed call from P. Pryor   Please contact further when possible

## 2022-05-06 ENCOUNTER — Other Ambulatory Visit: Payer: Self-pay

## 2022-05-06 ENCOUNTER — Ambulatory Visit: Payer: Self-pay | Attending: Nurse Practitioner | Admitting: Pharmacist

## 2022-05-06 ENCOUNTER — Other Ambulatory Visit (HOSPITAL_COMMUNITY): Payer: Self-pay

## 2022-05-06 VITALS — BP 152/76 | HR 73

## 2022-05-06 DIAGNOSIS — I10 Essential (primary) hypertension: Secondary | ICD-10-CM

## 2022-05-06 NOTE — Progress Notes (Signed)
    S:    PCP: [name]   Patient arrives in good spirits. Presents to the clinic for hypertension evaluation, counseling, and management. Patient was referred and last seen by Primary Care Provider on 04/01/2022. BP was 158/93 mmHg at that appt. Amlodipine was added to his regimen.   Medication adherence reported. He has taken his medication today.   Current BP Medications include: amlodipine 10 mg daily, valsartan-HCTZ 320-25 mg daily  Dietary habits include: does not limit sodium. Denies drinking excessive caffeine.  Exercise habits include: none outside of work  Family / Social history:  -Fhx: heart disease  -Tobacco: current 0.5 PPD smoker  -Alcohol: three 40 oz beers daily   O:  Vitals:   05/06/22 1600  BP: (!) 152/76  Pulse: 73    Home BP readings: none  Last 3 Office BP readings: BP Readings from Last 3 Encounters:  05/06/22 (!) 152/76  04/01/22 (!) 158/93  02/25/22 (!) 167/90   BMET    Component Value Date/Time   NA 138 04/01/2022 1637   K 3.9 04/01/2022 1637   CL 101 04/01/2022 1637   CO2 25 04/01/2022 1637   GLUCOSE 71 04/01/2022 1637   GLUCOSE 123 (H) 04/02/2017 1523   BUN 15 04/01/2022 1637   CREATININE 0.96 04/01/2022 1637   CREATININE 0.87 09/14/2014 1020   CALCIUM 9.8 04/01/2022 1637   GFRNONAA 88 04/24/2020 1607   GFRAA 102 04/24/2020 1607    Renal function: CrCl cannot be calculated (Patient's most recent lab result is older than the maximum 21 days allowed.).  Clinical ASCVD: No  The ASCVD Risk score (Arnett DK, et al., 2019) failed to calculate for the following reasons:   The valid total cholesterol range is 130 to 320 mg/dL  A/P: Hypertension longstanding currently above goal on current medications. BP Goal = < 130/80 mmHg. Medication adherence reported. His DBP has improved but SBP remains high. Will have him return in 1 month. If BP remains above goal and adherence is good, we can consider addition of spironolactone. -Continued amlodipine  10 mg daily. -Continue valsartan-HCTZ 320-25 mg daily.   -Counseled on lifestyle modifications for blood pressure control including reduced dietary sodium, increased exercise, adequate sleep.  Results reviewed and written information provided.   Total time in face-to-face counseling 10 minutes.   F/U Clinic Visit in March.    Benard Halsted, PharmD, [place], [place] [phone]

## 2022-05-09 ENCOUNTER — Other Ambulatory Visit (HOSPITAL_COMMUNITY): Payer: Self-pay

## 2022-05-26 ENCOUNTER — Ambulatory Visit (HOSPITAL_COMMUNITY): Admission: RE | Admit: 2022-05-26 | Payer: Self-pay | Source: Ambulatory Visit

## 2022-06-02 ENCOUNTER — Other Ambulatory Visit: Payer: Self-pay

## 2022-06-07 ENCOUNTER — Ambulatory Visit (HOSPITAL_COMMUNITY)
Admission: RE | Admit: 2022-06-07 | Discharge: 2022-06-07 | Disposition: A | Discharge status: Home / Self Care | Payer: Self-pay | Source: Ambulatory Visit | Attending: Cardiovascular Disease | Admitting: Cardiovascular Disease

## 2022-06-07 DIAGNOSIS — Z8679 Personal history of other diseases of the circulatory system: Secondary | ICD-10-CM | POA: Insufficient documentation

## 2022-06-17 ENCOUNTER — Encounter: Payer: Self-pay | Admitting: Pharmacist

## 2022-06-17 ENCOUNTER — Other Ambulatory Visit: Payer: Self-pay

## 2022-06-17 ENCOUNTER — Ambulatory Visit: Payer: Self-pay | Attending: Nurse Practitioner | Admitting: Pharmacist

## 2022-06-17 VITALS — BP 147/86 | HR 70

## 2022-06-17 DIAGNOSIS — I10 Essential (primary) hypertension: Secondary | ICD-10-CM

## 2022-06-17 MED ORDER — SPIRONOLACTONE 25 MG PO TABS
25.0000 mg | ORAL_TABLET | Freq: Every day | ORAL | 1 refills | Status: DC
  Filled 2022-06-17: qty 90, 90d supply, fill #0

## 2022-06-17 NOTE — Progress Notes (Signed)
    S:    PCP: [name]   Patient arrives in good spirits. Presents to the clinic for hypertension evaluation, counseling, and management. Patient was referred and last seen by Primary Care Provider on 04/01/2022. BP was 158/93 mmHg at that appt. I saw him on 05/06/22 and his BP was 152/76 mmHg. We made no changes at the time but recommend he return to see me in 1 month.   Of note, he had a AAA vas US done 06/07/2022. No aneruysm was visualized but there was evidence of abnormal dilation of the right common iliac artery, left common iliac artery, right external iliac artery and left external iliac artery. Additionally, he had mild atherosclerosis throughout the aorta with moderate atherosclerosis noted throughout his iliac arteries.   Medication adherence reported. He has taken his medication today.   Current BP Medications include: amlodipine 10 mg daily, valsartan-HCTZ 320-25 mg daily  Dietary habits include: does not limit sodium. Denies drinking excessive caffeine.  Exercise habits include: none outside of work  Family / Social history:  -Fhx: heart disease  -Tobacco: current 0.5 PPD smoker  -Alcohol: three 40 oz beers daily   O:  Vitals:   06/17/22 1549  BP: (!) 147/86  Pulse: 70   Home BP readings: none  Last 3 Office BP readings: BP Readings from Last 3 Encounters:  06/17/22 (!) 147/86  05/06/22 (!) 152/76  04/01/22 (!) 158/93   BMET    Component Value Date/Time   NA 138 04/01/2022 1637   K 3.9 04/01/2022 1637   CL 101 04/01/2022 1637   CO2 25 04/01/2022 1637   GLUCOSE 71 04/01/2022 1637   GLUCOSE 123 (H) 04/02/2017 1523   BUN 15 04/01/2022 1637   CREATININE 0.96 04/01/2022 1637   CREATININE 0.87 09/14/2014 1020   CALCIUM 9.8 04/01/2022 1637   GFRNONAA 88 04/24/2020 1607   GFRAA 102 04/24/2020 1607    Renal function: CrCl cannot be calculated (Patient's most recent lab result is older than the maximum 21 days allowed.).  Clinical ASCVD: No  The ASCVD Risk score  (Arnett DK, et al., 2019) failed to calculate for the following reasons:   The valid total cholesterol range is 130 to 320 mg/dL  A/P: Hypertension longstanding currently above goal on current medications. BP Goal = < 130/80 mmHg. Medication adherence reported. Will add spironolactone today. I discussed the need to have his K level checked ~1 week after being on the spironolactone. I have entered that order into Epic and he's agreeable to coming back for labs.  -Continued amlodipine 10 mg daily. -Continue valsartan-HCTZ 320-25 mg daily.   -Added spironolactone 25 mg daily.  -BMP8+eGFR, future  -Counseled on lifestyle modifications for blood pressure control including reduced dietary sodium, increased exercise, adequate sleep.  Results reviewed and written information provided.   Total time in face-to-face counseling 10 minutes.   F/U Clinic Visit in March.    Benard Halsted, PharmD, [place], [place] [phone]

## 2022-06-23 ENCOUNTER — Other Ambulatory Visit: Payer: Self-pay

## 2022-07-04 ENCOUNTER — Other Ambulatory Visit: Payer: Self-pay

## 2022-07-05 ENCOUNTER — Other Ambulatory Visit: Payer: Self-pay

## 2022-08-01 ENCOUNTER — Other Ambulatory Visit: Payer: Self-pay

## 2022-08-05 ENCOUNTER — Other Ambulatory Visit: Payer: Self-pay

## 2022-08-08 ENCOUNTER — Other Ambulatory Visit: Payer: Self-pay

## 2022-09-01 ENCOUNTER — Other Ambulatory Visit: Payer: Self-pay

## 2022-09-02 ENCOUNTER — Other Ambulatory Visit: Payer: Self-pay

## 2022-10-04 ENCOUNTER — Other Ambulatory Visit: Payer: Self-pay | Admitting: Nurse Practitioner

## 2022-10-04 DIAGNOSIS — I10 Essential (primary) hypertension: Secondary | ICD-10-CM

## 2022-10-05 ENCOUNTER — Other Ambulatory Visit: Payer: Self-pay

## 2022-10-05 MED ORDER — AMLODIPINE BESYLATE 10 MG PO TABS
10.0000 mg | ORAL_TABLET | Freq: Every day | ORAL | 0 refills | Status: DC
  Filled 2022-10-05: qty 30, 30d supply, fill #0

## 2022-10-07 ENCOUNTER — Ambulatory Visit: Payer: Self-pay | Attending: Nurse Practitioner | Admitting: Nurse Practitioner

## 2022-10-07 ENCOUNTER — Encounter: Payer: Self-pay | Admitting: Nurse Practitioner

## 2022-10-07 ENCOUNTER — Other Ambulatory Visit: Payer: Self-pay

## 2022-10-07 VITALS — BP 199/99 | HR 56 | Temp 98.0°F | Ht 71.0 in | Wt 208.8 lb

## 2022-10-07 DIAGNOSIS — R7303 Prediabetes: Secondary | ICD-10-CM

## 2022-10-07 DIAGNOSIS — R7989 Other specified abnormal findings of blood chemistry: Secondary | ICD-10-CM

## 2022-10-07 DIAGNOSIS — I10 Essential (primary) hypertension: Secondary | ICD-10-CM

## 2022-10-07 DIAGNOSIS — Z1211 Encounter for screening for malignant neoplasm of colon: Secondary | ICD-10-CM

## 2022-10-07 LAB — POCT GLYCOSYLATED HEMOGLOBIN (HGB A1C): HbA1c, POC (controlled diabetic range): 5.5 % (ref 0.0–7.0)

## 2022-10-07 MED ORDER — SPIRONOLACTONE 25 MG PO TABS
25.0000 mg | ORAL_TABLET | Freq: Every day | ORAL | 1 refills | Status: DC
  Filled 2022-10-07 – 2022-10-17 (×2): qty 90, 90d supply, fill #0

## 2022-10-07 NOTE — Progress Notes (Addendum)
Assessment & Plan:  Nute was seen today for prediabetes.  Diagnoses and all orders for this visit:  Primary hypertension Make sure to pick up spironolactone from the pharmacy. -     CMP14+EGFR -     spironolactone (ALDACTONE) 25 MG tablet; Take 1 tablet (25 mg total) by mouth daily. Continue amlodipine and Diovan HCT as prescribed.  Reminded to bring in blood pressure log for follow  up appointment.  RECOMMENDATIONS: DASH/Mediterranean Diets are healthier choices for HTN.    Prediabetes Well-controlled with diet only at this time. -     POCT glycosylated hemoglobin (Hb A1C)  Colon cancer screening -     Fecal occult blood, imunochemical([facility])  Abnormal CBC -     CBC with Differential    Patient has been counseled on age-appropriate routine health concerns for screening and prevention. These are reviewed and up-to-date. Referrals have been placed accordingly. Immunizations are up-to-date or declined.    Subjective:   Chief Complaint  Patient presents with   Prediabetes   HPI Randall Ferrell 57 y.o. male presents to office today for follow up to prediabetes and HTN  DM 2 Prediabetes is well-controlled  Lab Results  Component Value Date   HGBA1C 5.5 10/07/2022      HTN Blood pressure is significantly elevated. He states he has been out of his blood pressure medications amlodipine and Diovan HCT for 2 days.  Unfortunately he never was able to pick up the spironolactone that was prescribed for him in March.  Currently denies any visual changes, chest pain, shortness of breath or headache.  States he has not smoked a cigarette in several days. BP Readings from Last 3 Encounters:  10/07/22 (!) 199/99  06/17/22 (!) 147/86  05/06/22 (!) 152/76     Review of Systems  Constitutional:  Negative for fever, malaise/fatigue and weight loss.  HENT: Negative.  Negative for nosebleeds.   Eyes: Negative.  Negative for blurred vision, double vision and  photophobia.  Respiratory: Negative.  Negative for cough and shortness of breath.   Cardiovascular: Negative.  Negative for chest pain, palpitations and leg swelling.  Gastrointestinal: Negative.  Negative for heartburn, nausea and vomiting.  Musculoskeletal: Negative.  Negative for myalgias.  Neurological: Negative.  Negative for dizziness, focal weakness, seizures and headaches.  Psychiatric/Behavioral: Negative.  Negative for suicidal ideas.     Past Medical History:  Diagnosis Date   AAA (abdominal aortic aneurysm) (HCC) per duplex at dr Jacinto Halim office 10-29-2014   moderate distal aorta  w/ bilateral iliac artery involvement 2.45cm X 3.43cm/  involves bilateral iliac vessels 12mm   Heart murmur    Hyperlipidemia    Hypertension    Incomplete right bundle branch block    Left hydrocele     Past Surgical History:  Procedure Laterality Date   CARDIOVASCULAR STRESS TEST  09-22-2014  dr Jacinto Halim   normal perfusion study/  normal LV function and wall motion , ef 62%   HYDROCELE EXCISION Left 02/02/2015   Procedure: HYDROCELECTOMY ADULT;  Surgeon: Hildred Laser, MD;  Location: [facility];  Service: Urology;  Laterality: Left;   TRANSTHORACIC ECHOCARDIOGRAM  10-29-2014   dr Jacinto Halim   moderate concentric LVH, ef 60%/  mild AR, MR, and TR/  IVC borderline dilated w/ respiratory variation    Family History  Problem Relation Age of Onset   Heart disease Father    Heart disease Brother     Social History Reviewed with no changes to be  made today.   Outpatient Medications Prior to Visit  Medication Sig Dispense Refill   amLODipine (NORVASC) 10 MG tablet Take 1 tablet (10 mg total) by mouth daily. 30 tablet 0   atorvastatin (LIPITOR) 20 MG tablet Take 1 tablet (20 mg total) by mouth daily. 90 tablet 3   fluticasone (FLONASE) 50 MCG/ACT nasal spray Place 2 sprays into both nostrils daily. 16 g 6   gabapentin (NEURONTIN) 600 MG tablet Take 1 tablet (600 mg total) by mouth  daily. 90 tablet 1   terbinafine (LAMISIL AT) 1 % cream Apply 1 application topically 2 (two) times daily. 100 g 0   traMADol (ULTRAM) 50 MG tablet Take 1 tablet (50 mg total) by mouth every 8 (eight) hours as needed. 60 tablet 3   valsartan-hydrochlorothiazide (DIOVAN-HCT) 320-25 MG tablet Take 1 tablet by mouth daily. 90 tablet 3   spironolactone (ALDACTONE) 25 MG tablet Take 1 tablet (25 mg total) by mouth daily. 90 tablet 1   No facility-administered medications prior to visit.    No Known Allergies     Objective:    BP (!) 199/99   Pulse (!) 56   Temp 98 F (36.7 C) (Oral)   Ht 5\' 11"  (1.803 m)   Wt 208 lb 12.8 oz (94.7 kg)   SpO2 100%   BMI 29.12 kg/m  Wt Readings from Last 3 Encounters:  10/07/22 208 lb 12.8 oz (94.7 kg)  04/01/22 211 lb 9.6 oz (96 kg)  02/25/22 213 lb 3.2 oz (96.7 kg)    Physical Exam Vitals and nursing note reviewed.  Constitutional:      Appearance: He is well-developed.  HENT:     Head: Normocephalic and atraumatic.  Cardiovascular:     Rate and Rhythm: Regular rhythm. Bradycardia present.     Heart sounds: Normal heart sounds. No murmur heard.    No friction rub. No gallop.  Pulmonary:     Effort: Pulmonary effort is normal. No tachypnea or respiratory distress.     Breath sounds: Normal breath sounds. No decreased breath sounds, wheezing, rhonchi or rales.  Chest:     Chest wall: No tenderness.  Abdominal:     General: Bowel sounds are normal.     Palpations: Abdomen is soft.  Musculoskeletal:        General: Normal range of motion.     Cervical back: Normal range of motion.  Skin:    General: Skin is warm and dry.  Neurological:     Mental Status: He is alert and oriented to person, place, and time.     Coordination: Coordination normal.  Psychiatric:        Behavior: Behavior normal. Behavior is cooperative.        Thought Content: Thought content normal.        Judgment: Judgment normal.          Patient has been  counseled extensively about nutrition and exercise as well as the importance of adherence with medications and regular follow-up. The patient was given clear instructions to go to ER or return to medical center if symptoms don't improve, worsen or new problems develop. The patient verbalized understanding.   Follow-up: Return in about 3 weeks (around 10/28/2022) for BP CHECK. DB410.   Claiborne Rigg, FNP-BC [facility] [place], [place] [phone]   10/07/2022, 4:39 PM

## 2022-10-13 ENCOUNTER — Other Ambulatory Visit: Payer: Self-pay

## 2022-10-17 ENCOUNTER — Other Ambulatory Visit: Payer: Self-pay

## 2022-10-28 ENCOUNTER — Encounter: Payer: Self-pay | Admitting: Nurse Practitioner

## 2022-10-28 ENCOUNTER — Ambulatory Visit: Payer: Self-pay | Attending: Nurse Practitioner | Admitting: Nurse Practitioner

## 2022-10-28 VITALS — BP 151/86 | HR 66 | Ht 71.0 in | Wt 206.0 lb

## 2022-10-28 DIAGNOSIS — I10 Essential (primary) hypertension: Secondary | ICD-10-CM

## 2022-10-28 NOTE — Progress Notes (Signed)
Assessment & Plan:  There are no diagnoses linked to this encounter.  Patient has been counseled on age-appropriate routine health concerns for screening and prevention. These are reviewed and up-to-date. Referrals have been placed accordingly. Immunizations are up-to-date or declined.    Subjective:   Chief Complaint  Patient presents with  . Medical Management of Chronic Issues   HPI Randall Ferrell 57 y.o. male presents to office today  BP Readings from Last 3 Encounters:  10/28/22 (!) 151/86  10/07/22 (!) 199/99  06/17/22 (!) 147/86     ROS  Past Medical History:  Diagnosis Date  . AAA (abdominal aortic aneurysm) (HCC) per duplex at dr Jacinto Halim office 10-29-2014   moderate distal aorta  w/ bilateral iliac artery involvement 2.45cm X 3.43cm/  involves bilateral iliac vessels 12mm  . Heart murmur   . Hyperlipidemia   . Hypertension   . Incomplete right bundle branch block   . Left hydrocele     Past Surgical History:  Procedure Laterality Date  . CARDIOVASCULAR STRESS TEST  09-22-2014  dr Jacinto Halim   normal perfusion study/  normal LV function and wall motion , ef 62%  . HYDROCELE EXCISION Left 02/02/2015   Procedure: HYDROCELECTOMY ADULT;  Surgeon: Hildred Laser, MD;  Location: [facility];  Service: Urology;  Laterality: Left;  . TRANSTHORACIC ECHOCARDIOGRAM  10-29-2014   dr Jacinto Halim   moderate concentric LVH, ef 60%/  mild AR, MR, and TR/  IVC borderline dilated w/ respiratory variation    Family History  Problem Relation Age of Onset  . Heart disease Father   . Heart disease Brother     Social History Reviewed with no changes to be made today.   Outpatient Medications Prior to Visit  Medication Sig Dispense Refill  . amLODipine (NORVASC) 10 MG tablet Take 1 tablet (10 mg total) by mouth daily. 30 tablet 0  . atorvastatin (LIPITOR) 20 MG tablet Take 1 tablet (20 mg total) by mouth daily. 90 tablet 3  . gabapentin (NEURONTIN) 600 MG tablet Take 1  tablet (600 mg total) by mouth daily. 90 tablet 1  . spironolactone (ALDACTONE) 25 MG tablet Take 1 tablet (25 mg total) by mouth daily. 90 tablet 1  . valsartan-hydrochlorothiazide (DIOVAN-HCT) 320-25 MG tablet Take 1 tablet by mouth daily. 90 tablet 3  . fluticasone (FLONASE) 50 MCG/ACT nasal spray Place 2 sprays into both nostrils daily. 16 g 6  . terbinafine (LAMISIL AT) 1 % cream Apply 1 application topically 2 (two) times daily. 100 g 0  . traMADol (ULTRAM) 50 MG tablet Take 1 tablet (50 mg total) by mouth every 8 (eight) hours as needed. 60 tablet 3   No facility-administered medications prior to visit.    No Known Allergies     Objective:    BP (!) 151/86 (BP Location: Left Arm, Patient Position: Sitting, Cuff Size: Normal)   Pulse 66   Ht 5\' 11"  (1.803 m)   Wt 206 lb (93.4 kg)   SpO2 100%   BMI 28.73 kg/m  Wt Readings from Last 3 Encounters:  10/28/22 206 lb (93.4 kg)  10/07/22 208 lb 12.8 oz (94.7 kg)  04/01/22 211 lb 9.6 oz (96 kg)    Physical Exam       Patient has been counseled extensively about nutrition and exercise as well as the importance of adherence with medications and regular follow-up. The patient was given clear instructions to go to ER or return to medical center if  symptoms don't improve, worsen or new problems develop. The patient verbalized understanding.   Follow-up: No follow-ups on file.   Claiborne Rigg, FNP-BC [facility] [place], [place] [phone]   10/28/2022, 4:03 PM

## 2022-11-01 ENCOUNTER — Other Ambulatory Visit: Payer: Self-pay

## 2022-11-01 ENCOUNTER — Encounter: Payer: Self-pay | Admitting: Nurse Practitioner

## 2022-11-01 MED ORDER — SPIRONOLACTONE 50 MG PO TABS
50.0000 mg | ORAL_TABLET | Freq: Every day | ORAL | 1 refills | Status: DC
  Filled 2022-11-01 – 2022-12-20 (×2): qty 90, 90d supply, fill #0

## 2022-11-01 NOTE — Progress Notes (Signed)
Unable to reach patient by phone. Unable to leave a voicemail for patient.

## 2022-11-04 ENCOUNTER — Other Ambulatory Visit: Payer: Self-pay | Admitting: Family Medicine

## 2022-11-04 DIAGNOSIS — I10 Essential (primary) hypertension: Secondary | ICD-10-CM

## 2022-11-07 ENCOUNTER — Other Ambulatory Visit: Payer: Self-pay

## 2022-11-07 MED ORDER — AMLODIPINE BESYLATE 10 MG PO TABS
10.0000 mg | ORAL_TABLET | Freq: Every day | ORAL | 1 refills | Status: DC
Start: 2022-11-07 — End: 2023-02-03
  Filled 2022-11-07: qty 90, 90d supply, fill #0
  Filled 2023-02-01 – 2023-02-03 (×2): qty 90, 90d supply, fill #1

## 2022-11-07 NOTE — Telephone Encounter (Signed)
Requested Prescriptions  Pending Prescriptions Disp Refills   amLODipine (NORVASC) 10 MG tablet 90 tablet 1    Sig: Take 1 tablet (10 mg total) by mouth daily.     Cardiovascular: Calcium Channel Blockers 2 Failed - 11/04/2022  7:57 PM      Failed - Last BP in normal range    BP Readings from Last 1 Encounters:  10/28/22 (!) 151/86         Passed - Last Heart Rate in normal range    Pulse Readings from Last 1 Encounters:  10/28/22 66         Passed - Valid encounter within last 6 months    Recent Outpatient Visits           1 week ago Primary hypertension   [facility] [place], Shea Stakes, NP   1 month ago Primary hypertension   [place] [facility] [place], Shea Stakes, NP   4 months ago Primary hypertension   [facility] [place], Cornelius Moras, RPH-CPP   6 months ago Primary hypertension   [facility] [place], Cornelius Moras, RPH-CPP   7 months ago Primary hypertension   [facility], Shea Stakes, NP       Future Appointments             In 2 months Claiborne Rigg, NP [facility]

## 2022-11-08 ENCOUNTER — Other Ambulatory Visit: Payer: Self-pay

## 2022-12-20 ENCOUNTER — Other Ambulatory Visit: Payer: Self-pay

## 2022-12-21 ENCOUNTER — Other Ambulatory Visit: Payer: Self-pay

## 2022-12-22 ENCOUNTER — Ambulatory Visit: Payer: Self-pay

## 2022-12-22 NOTE — Telephone Encounter (Signed)
Third attempt.  Left message to call back.

## 2022-12-22 NOTE — Telephone Encounter (Signed)
Summary: med ?   Pt's girlfriend called in, for pt wanting to know if it is correct that his med, spironolactone (ALDACTONE) 50 was increased to 50 mg , correct dose that showing in the system. Pt phone # is [phone]         Left message to call back.

## 2022-12-22 NOTE — Telephone Encounter (Signed)
Second attempt to reach pt. Left message to call back.

## 2022-12-23 NOTE — Telephone Encounter (Signed)
VM left informing patient to return phone call.

## 2023-01-23 ENCOUNTER — Other Ambulatory Visit: Payer: Self-pay

## 2023-02-01 ENCOUNTER — Other Ambulatory Visit: Payer: Self-pay | Admitting: Nurse Practitioner

## 2023-02-01 DIAGNOSIS — G8929 Other chronic pain: Secondary | ICD-10-CM

## 2023-02-02 ENCOUNTER — Other Ambulatory Visit: Payer: Self-pay

## 2023-02-03 ENCOUNTER — Encounter: Payer: Self-pay | Admitting: Nurse Practitioner

## 2023-02-03 ENCOUNTER — Other Ambulatory Visit: Payer: Self-pay

## 2023-02-03 ENCOUNTER — Ambulatory Visit: Payer: Self-pay | Attending: Nurse Practitioner | Admitting: Nurse Practitioner

## 2023-02-03 VITALS — BP 156/94 | HR 60 | Ht 71.0 in | Wt 206.0 lb

## 2023-02-03 DIAGNOSIS — M79604 Pain in right leg: Secondary | ICD-10-CM

## 2023-02-03 DIAGNOSIS — G8929 Other chronic pain: Secondary | ICD-10-CM

## 2023-02-03 DIAGNOSIS — I10 Essential (primary) hypertension: Secondary | ICD-10-CM

## 2023-02-03 DIAGNOSIS — E785 Hyperlipidemia, unspecified: Secondary | ICD-10-CM

## 2023-02-03 DIAGNOSIS — M79605 Pain in left leg: Secondary | ICD-10-CM

## 2023-02-03 MED ORDER — SPIRONOLACTONE 50 MG PO TABS
50.0000 mg | ORAL_TABLET | Freq: Every day | ORAL | 1 refills | Status: DC
Start: 2023-02-03 — End: 2023-05-12
  Filled 2023-02-03 – 2023-02-19 (×2): qty 90, 90d supply, fill #0
  Filled 2023-05-03 – 2023-05-04 (×2): qty 90, 90d supply, fill #1
  Filled ????-??-??: fill #1

## 2023-02-03 MED ORDER — ATORVASTATIN CALCIUM 20 MG PO TABS
20.0000 mg | ORAL_TABLET | Freq: Every day | ORAL | 3 refills | Status: AC
  Filled 2023-05-03: qty 90, 90d supply, fill #0
  Filled 2023-08-10 (×2): qty 90, 90d supply, fill #1
  Filled 2024-01-17 – 2024-01-18 (×2): qty 90, 90d supply, fill #2

## 2023-02-03 MED ORDER — VALSARTAN-HYDROCHLOROTHIAZIDE 320-25 MG PO TABS
1.0000 | ORAL_TABLET | Freq: Every day | ORAL | 3 refills | Status: DC
Start: 2023-02-03 — End: 2023-05-12
  Filled 2023-02-03 – 2023-02-19 (×2): qty 90, 90d supply, fill #0
  Filled 2023-05-03 – 2023-05-04 (×2): qty 90, 90d supply, fill #1
  Filled ????-??-??: fill #1

## 2023-02-03 MED ORDER — GABAPENTIN 600 MG PO TABS
600.0000 mg | ORAL_TABLET | Freq: Every day | ORAL | 1 refills | Status: DC
Start: 2023-02-03 — End: 2023-08-11
  Filled 2023-02-03 – 2023-02-19 (×2): qty 90, 90d supply, fill #0
  Filled 2023-05-03 – 2023-08-11 (×4): qty 90, 90d supply, fill #1

## 2023-02-03 MED ORDER — AMLODIPINE BESYLATE 10 MG PO TABS
10.0000 mg | ORAL_TABLET | Freq: Every day | ORAL | 1 refills | Status: DC
Start: 2023-02-03 — End: 2024-01-19
  Filled 2023-05-03: qty 90, 90d supply, fill #0

## 2023-02-03 NOTE — Progress Notes (Signed)
Assessment & Plan:  Randall Ferrell was seen today for medical management of chronic issues.  Diagnoses and all orders for this visit:  Primary hypertension Blood pressure continues elevated we may need to add carvedilol or increase spironolactone. -     valsartan-hydrochlorothiazide (DIOVAN-HCT) 320-25 MG tablet; Take 1 tablet by mouth daily. -     amLODipine (NORVASC) 10 MG tablet; Take 1 tablet (10 mg total) by mouth daily. -     spironolactone (ALDACTONE) 50 MG tablet; Take 1 tablet (50 mg total) by mouth daily. Continue blood sugar control as discussed in office today, low carbohydrate diet, and regular physical exercise as tolerated, 150 minutes per week (30 min each day, 5 days per week, or 50 min 3 days per week). Keep blood sugar logs with fasting goal of 90-130 mg/dl, post prandial (after you eat) less than 180.  For Hypoglycemia: BS <60 and Hyperglycemia BS >400; contact the clinic ASAP. Annual eye exams and foot exams are recommended.   Chronic pain of both lower extremities Chronic and stable at this time. -     gabapentin (NEURONTIN) 600 MG tablet; Take 1 tablet (600 mg total) by mouth daily.  Dyslipidemia, goal LDL below 70 -     atorvastatin (LIPITOR) 20 MG tablet; Take 1 tablet (20 mg total) by mouth daily. INSTRUCTIONS: Work on a low fat, heart healthy diet and participate in regular aerobic exercise program by working out at least 150 minutes per week; 5 days a week-30 minutes per day. Avoid red meat/beef/steak,  fried foods. junk foods, sodas, sugary drinks, unhealthy snacking, alcohol and smoking.  Drink at least 80 oz of water per day and monitor your carbohydrate intake daily.     Patient has been counseled on age-appropriate routine health concerns for screening and prevention. These are reviewed and up-to-date. Referrals have been placed accordingly. Immunizations are up-to-date or declined.    Subjective:   Chief Complaint  Patient presents with   Medical Management  of Chronic Issues    Randall Ferrell 57 y.o. male presents to office today for follow-up to hypertension.  He has a past medical history of AAA (abdominal aortic aneurysm) (HCC) (per duplex at dr Jacinto Halim office 10-29-2014), Heart murmur, Hyperlipidemia, Hypertension, Incomplete right bundle branch block, and Left hydrocele.    HTN Blood pressure is not at goal.  He was not aware his spironolactone dosage had been increased to 50 mg..  Currently prescribed spironolactone 50 mg daily, amlodipine 10 mg daily and Diovan HCT 320-25 mg daily.  He does not have any of his medications with him so I am unable to review his pill bottles today. BP Readings from Last 3 Encounters:  02/03/23 (!) 154/91  10/28/22 (!) 151/86  10/07/22 (!) 199/99        Review of Systems  Constitutional:  Negative for fever, malaise/fatigue and weight loss.  HENT: Negative.  Negative for nosebleeds.   Eyes: Negative.  Negative for blurred vision, double vision and photophobia.  Respiratory: Negative.  Negative for cough and shortness of breath.   Cardiovascular: Negative.  Negative for chest pain, palpitations and leg swelling.  Gastrointestinal: Negative.  Negative for heartburn, nausea and vomiting.  Musculoskeletal:  Positive for joint pain and myalgias.  Neurological: Negative.  Negative for dizziness, focal weakness, seizures and headaches.  Psychiatric/Behavioral: Negative.  Negative for suicidal ideas.     Past Medical History:  Diagnosis Date   AAA (abdominal aortic aneurysm) (HCC) per duplex at dr Jacinto Halim office 10-29-2014  moderate distal aorta  w/ bilateral iliac artery involvement 2.45cm X 3.43cm/  involves bilateral iliac vessels 12mm   Heart murmur    Hyperlipidemia    Hypertension    Incomplete right bundle branch block    Left hydrocele     Past Surgical History:  Procedure Laterality Date   CARDIOVASCULAR STRESS TEST  09-22-2014  dr Jacinto Halim   normal perfusion study/  normal LV function and  wall motion , ef 62%   HYDROCELE EXCISION Left 02/02/2015   Procedure: HYDROCELECTOMY ADULT;  Surgeon: Hildred Laser, MD;  [facility]: [facility];  Service: Urology;  Laterality: Left;   TRANSTHORACIC ECHOCARDIOGRAM  10-29-2014   dr Jacinto Halim   moderate concentric LVH, ef 60%/  mild AR, MR, and TR/  IVC borderline dilated w/ respiratory variation    Family History  Problem Relation Age of Onset   Heart disease Father    Heart disease Brother     Social History Reviewed with no changes to be made today.   Outpatient Medications Prior to Visit  Medication Sig Dispense Refill   amLODipine (NORVASC) 10 MG tablet Take 1 tablet (10 mg total) by mouth daily. 90 tablet 1   atorvastatin (LIPITOR) 20 MG tablet Take 1 tablet (20 mg total) by mouth daily. 90 tablet 3   gabapentin (NEURONTIN) 600 MG tablet Take 1 tablet (600 mg total) by mouth daily. 90 tablet 1   spironolactone (ALDACTONE) 50 MG tablet Take 1 tablet (50 mg total) by mouth daily. 90 tablet 1   valsartan-hydrochlorothiazide (DIOVAN-HCT) 320-25 MG tablet Take 1 tablet by mouth daily. 90 tablet 3   No facility-administered medications prior to visit.    No Known Allergies     Objective:    BP (!) 154/91 (BP Location: Left Arm, Patient Position: Sitting, Cuff Size: Normal)   Pulse 60   Ht 5\' 11"  (1.803 m)   Wt 206 lb (93.4 kg)   SpO2 100%   BMI 28.73 kg/m  Wt Readings from Last 3 Encounters:  02/03/23 206 lb (93.4 kg)  10/28/22 206 lb (93.4 kg)  10/07/22 208 lb 12.8 oz (94.7 kg)    Physical Exam Vitals and nursing note reviewed.  Constitutional:      Appearance: He is well-developed.  HENT:     Head: Normocephalic and atraumatic.  Cardiovascular:     Rate and Rhythm: Normal rate and regular rhythm.     Heart sounds: Normal heart sounds. No murmur heard.    No friction rub. No gallop.  Pulmonary:     Effort: Pulmonary effort is normal. No tachypnea or respiratory distress.     Breath sounds: Normal  breath sounds. No decreased breath sounds, wheezing, rhonchi or rales.  Chest:     Chest wall: No tenderness.  Abdominal:     General: Bowel sounds are normal.     Palpations: Abdomen is soft.  Musculoskeletal:        General: Normal range of motion.     Cervical back: Normal range of motion.  Skin:    General: Skin is warm and dry.  Neurological:     Mental Status: He is alert and oriented to person, place, and time.     Coordination: Coordination normal.  Psychiatric:        Behavior: Behavior normal. Behavior is cooperative.        Thought Content: Thought content normal.        Judgment: Judgment normal.  Patient has been counseled extensively about nutrition and exercise as well as the importance of adherence with medications and regular follow-up. The patient was given clear instructions to go to ER or return to medical center if symptoms don't improve, worsen or new problems develop. The patient verbalized understanding.   Follow-up: No follow-ups on file.   Claiborne Rigg, FNP-BC [facility] [place], [place] [phone]   02/03/2023, 3:58 PM

## 2023-02-06 ENCOUNTER — Other Ambulatory Visit: Payer: Self-pay

## 2023-02-15 ENCOUNTER — Other Ambulatory Visit: Payer: Self-pay

## 2023-02-20 ENCOUNTER — Other Ambulatory Visit: Payer: Self-pay

## 2023-03-01 ENCOUNTER — Encounter: Payer: Self-pay | Admitting: Nurse Practitioner

## 2023-05-03 ENCOUNTER — Other Ambulatory Visit: Payer: Self-pay

## 2023-05-04 ENCOUNTER — Other Ambulatory Visit: Payer: Self-pay

## 2023-05-05 ENCOUNTER — Other Ambulatory Visit: Payer: Self-pay

## 2023-05-12 ENCOUNTER — Encounter: Payer: Self-pay | Admitting: Nurse Practitioner

## 2023-05-12 ENCOUNTER — Other Ambulatory Visit: Payer: Self-pay

## 2023-05-12 ENCOUNTER — Ambulatory Visit: Payer: Self-pay | Attending: Nurse Practitioner | Admitting: Nurse Practitioner

## 2023-05-12 VITALS — BP 124/70 | HR 72 | Resp 20 | Ht 71.0 in | Wt 211.6 lb

## 2023-05-12 DIAGNOSIS — M79605 Pain in left leg: Secondary | ICD-10-CM

## 2023-05-12 DIAGNOSIS — I10 Essential (primary) hypertension: Secondary | ICD-10-CM

## 2023-05-12 DIAGNOSIS — E785 Hyperlipidemia, unspecified: Secondary | ICD-10-CM

## 2023-05-12 DIAGNOSIS — G8929 Other chronic pain: Secondary | ICD-10-CM

## 2023-05-12 DIAGNOSIS — M79604 Pain in right leg: Secondary | ICD-10-CM

## 2023-05-12 MED ORDER — SPIRONOLACTONE 50 MG PO TABS
50.0000 mg | ORAL_TABLET | Freq: Every day | ORAL | 1 refills | Status: DC
Start: 2023-05-12 — End: 2023-08-11
  Filled 2023-05-12 (×2): qty 90, 90d supply, fill #0
  Filled 2023-08-10 (×2): qty 90, 90d supply, fill #1

## 2023-05-12 MED ORDER — VALSARTAN-HYDROCHLOROTHIAZIDE 320-25 MG PO TABS
1.0000 | ORAL_TABLET | Freq: Every day | ORAL | 3 refills | Status: AC
  Filled 2023-05-12: qty 90, 90d supply, fill #0
  Filled 2023-08-10: qty 90, 90d supply, fill #1
  Filled 2023-08-10: qty 30, 30d supply, fill #1
  Filled 2023-09-26 (×2): qty 30, 30d supply, fill #2
  Filled 2023-11-15: qty 30, 30d supply, fill #3
  Filled 2024-01-17 – 2024-01-18 (×2): qty 30, 30d supply, fill #4
  Filled 2024-03-18 (×2): qty 30, 30d supply, fill #5

## 2023-05-12 MED ORDER — MELOXICAM 15 MG PO TABS
15.0000 mg | ORAL_TABLET | Freq: Every day | ORAL | 1 refills | Status: AC
Start: 2023-05-12 — End: ?
  Filled 2023-05-12: qty 30, 30d supply, fill #0
  Filled 2023-08-10 (×2): qty 30, 30d supply, fill #1

## 2023-05-12 MED ORDER — CARVEDILOL 6.25 MG PO TABS
6.2500 mg | ORAL_TABLET | Freq: Two times a day (BID) | ORAL | 1 refills | Status: DC
Start: 2023-05-12 — End: 2024-01-19
  Filled 2023-05-12: qty 180, 90d supply, fill #0
  Filled 2023-09-26 (×2): qty 180, 90d supply, fill #1

## 2023-05-12 NOTE — Progress Notes (Signed)
Assessment & Plan:  Randall Ferrell was seen today for medical management of chronic issues.  Diagnoses and all orders for this visit:  Primary hypertension DOSE CHANGE Added carvedilol today -     spironolactone (ALDACTONE) 50 MG tablet; Take 1 tablet (50 mg total) by mouth daily. -     carvedilol (COREG) 6.25 MG tablet; Take 1 tablet (6.25 mg total) by mouth 2 (two) times daily with a meal. FOR BLOOD PRESSURE -     valsartan-hydrochlorothiazide (DIOVAN-HCT) 320-25 MG tablet; Take 1 tablet by mouth daily. -     CMP14+EGFR  Dyslipidemia, goal LDL below 70 -     Lipid panel INSTRUCTIONS: Work on a low fat, heart healthy diet and participate in regular aerobic exercise program by working out at least 150 minutes per week; 5 days a week-30 minutes per day. Avoid red meat/beef/steak,  fried foods. junk foods, sodas, sugary drinks, unhealthy snacking, alcohol and smoking.  Drink at least 80 oz of water per day and monitor your carbohydrate intake daily.    Chronic pain of both lower extremities Negative ABI 12-2019 -     meloxicam (MOBIC) 15 MG tablet; Take 1 tablet (15 mg total) by mouth daily. For leg pain    Patient has been counseled on age-appropriate routine health concerns for screening and prevention. These are reviewed and up-to-date. Referrals have been placed accordingly. Immunizations are up-to-date or declined.    Subjective:   Chief Complaint  Patient presents with   Medical Management of Chronic Issues    Randall Ferrell 58 y.o. male presents to office today for follow up to HTN  He has a past medical history of AAA (abdominal aortic aneurysm) (HCC) (per duplex at dr Jacinto Halim office 10-29-2014), Heart murmur, Hyperlipidemia, Hypertension, Incomplete right bundle branch block, and Left hydrocele.      HTN Blood pressure is not at goal. Currently prescribed spironolactone 50 mg daily, amlodipine 10 mg daily and Diovan HCT 320-25 mg daily.  He endorses medication adherence.  BP  Readings from Last 3 Encounters:  05/12/23 (!) 152/81  02/03/23 (!) 156/94  10/28/22 (!) 151/86     Review of Systems  Constitutional:  Negative for fever, malaise/fatigue and weight loss.  HENT: Negative.  Negative for nosebleeds.   Eyes: Negative.  Negative for blurred vision, double vision and photophobia.  Respiratory: Negative.  Negative for cough and shortness of breath.   Cardiovascular: Negative.  Negative for chest pain, palpitations and leg swelling.  Gastrointestinal: Negative.  Negative for heartburn, nausea and vomiting.  Musculoskeletal:  Positive for joint pain. Negative for myalgias.  Neurological: Negative.  Negative for dizziness, focal weakness, seizures and headaches.  Psychiatric/Behavioral: Negative.  Negative for suicidal ideas.     Past Medical History:  Diagnosis Date   Heart murmur    Hyperlipidemia    Hypertension    Incomplete right bundle branch block    Left hydrocele     Past Surgical History:  Procedure Laterality Date   CARDIOVASCULAR STRESS TEST  09-22-2014  dr Jacinto Halim   normal perfusion study/  normal LV function and wall motion , ef 62%   HYDROCELE EXCISION Left 02/02/2015   Procedure: HYDROCELECTOMY ADULT;  Surgeon: Hildred Laser, MD;  Location: [facility];  Service: Urology;  Laterality: Left;   TRANSTHORACIC ECHOCARDIOGRAM  10-29-2014   dr Jacinto Halim   moderate concentric LVH, ef 60%/  mild AR, MR, and TR/  IVC borderline dilated w/ respiratory variation    Family History  Problem Relation Age of Onset   Heart disease Father    Heart disease Brother     Social History Reviewed with no changes to be made today.   Outpatient Medications Prior to Visit  Medication Sig Dispense Refill   amLODipine (NORVASC) 10 MG tablet Take 1 tablet (10 mg total) by mouth daily. 90 tablet 1   atorvastatin (LIPITOR) 20 MG tablet Take 1 tablet (20 mg total) by mouth daily. 90 tablet 3   gabapentin (NEURONTIN) 600 MG tablet Take 1 tablet  (600 mg total) by mouth daily. 90 tablet 1   spironolactone (ALDACTONE) 50 MG tablet Take 1 tablet (50 mg total) by mouth daily. 90 tablet 1   valsartan-hydrochlorothiazide (DIOVAN-HCT) 320-25 MG tablet Take 1 tablet by mouth daily. 90 tablet 3   No facility-administered medications prior to visit.    No Known Allergies     Objective:    BP (!) 152/81 (BP Location: Left Arm, Patient Position: Sitting, Cuff Size: Normal)   Pulse 69   Resp 20   Ht 5\' 11"  (1.803 m)   Wt 211 lb 9.6 oz (96 kg)   SpO2 98%   BMI 29.51 kg/m  Wt Readings from Last 3 Encounters:  05/12/23 211 lb 9.6 oz (96 kg)  02/03/23 206 lb (93.4 kg)  10/28/22 206 lb (93.4 kg)    Physical Exam Vitals and nursing note reviewed.  Constitutional:      Appearance: He is well-developed.  HENT:     Head: Normocephalic and atraumatic.  Cardiovascular:     Rate and Rhythm: Normal rate and regular rhythm.     Heart sounds: Normal heart sounds. No murmur heard.    No friction rub. No gallop.  Pulmonary:     Effort: Pulmonary effort is normal. No tachypnea or respiratory distress.     Breath sounds: Normal breath sounds. No decreased breath sounds, wheezing, rhonchi or rales.  Chest:     Chest wall: No tenderness.  Abdominal:     General: Bowel sounds are normal.     Palpations: Abdomen is soft.  Musculoskeletal:        General: Normal range of motion.     Cervical back: Normal range of motion.  Skin:    General: Skin is warm and dry.  Neurological:     Mental Status: He is alert and oriented to person, place, and time.     Coordination: Coordination normal.  Psychiatric:        Behavior: Behavior normal. Behavior is cooperative.        Thought Content: Thought content normal.        Judgment: Judgment normal.          Patient has been counseled extensively about nutrition and exercise as well as the importance of adherence with medications and regular follow-up. The patient was given clear instructions to  go to ER or return to medical center if symptoms don't improve, worsen or new problems develop. The patient verbalized understanding.   Follow-up: Return in about 3 months (around 08/09/2023).   Claiborne Rigg, FNP-BC [facility] [place], [place] [phone]   05/12/2023, 3:17 PM

## 2023-05-13 LAB — CMP14+EGFR
ALT: 18 [IU]/L (ref 0–44)
AST: 12 [IU]/L (ref 0–40)
Albumin: 4.1 g/dL (ref 3.8–4.9)
Alkaline Phosphatase: 80 [IU]/L (ref 44–121)
BUN/Creatinine Ratio: 14 (ref 9–20)
BUN: 14 mg/dL (ref 6–24)
Bilirubin Total: 1.3 mg/dL — ABNORMAL HIGH (ref 0.0–1.2)
CO2: 23 mmol/L (ref 20–29)
Calcium: 9.6 mg/dL (ref 8.7–10.2)
Chloride: 104 mmol/L (ref 96–106)
Creatinine, Ser: 0.98 mg/dL (ref 0.76–1.27)
Globulin, Total: 2.2 g/dL (ref 1.5–4.5)
Glucose: 112 mg/dL — ABNORMAL HIGH (ref 70–99)
Potassium: 4.5 mmol/L (ref 3.5–5.2)
Sodium: 138 mmol/L (ref 134–144)
Total Protein: 6.3 g/dL (ref 6.0–8.5)
eGFR: 89 mL/min/{1.73_m2} (ref >59)

## 2023-05-13 LAB — LIPID PANEL
Chol/HDL Ratio: 2.6 {ratio} (ref 0.0–5.0)
Cholesterol, Total: 113 mg/dL (ref 100–199)
HDL: 43 mg/dL (ref >39)
LDL Chol Calc (NIH): 58 mg/dL (ref 0–99)
Triglycerides: 52 mg/dL (ref 0–149)
VLDL Cholesterol Cal: 12 mg/dL (ref 5–40)

## 2023-05-19 ENCOUNTER — Other Ambulatory Visit: Payer: Self-pay

## 2023-05-30 ENCOUNTER — Other Ambulatory Visit: Payer: Self-pay

## 2023-08-10 ENCOUNTER — Other Ambulatory Visit: Payer: Self-pay

## 2023-08-11 ENCOUNTER — Encounter: Payer: Self-pay | Admitting: Nurse Practitioner

## 2023-08-11 ENCOUNTER — Other Ambulatory Visit: Payer: Self-pay

## 2023-08-11 ENCOUNTER — Ambulatory Visit: Payer: Self-pay | Attending: Nurse Practitioner | Admitting: Nurse Practitioner

## 2023-08-11 VITALS — BP 125/75 | HR 60 | Resp 19 | Ht 71.0 in | Wt 205.0 lb

## 2023-08-11 DIAGNOSIS — R350 Frequency of micturition: Secondary | ICD-10-CM

## 2023-08-11 DIAGNOSIS — M79605 Pain in left leg: Secondary | ICD-10-CM

## 2023-08-11 DIAGNOSIS — G8929 Other chronic pain: Secondary | ICD-10-CM

## 2023-08-11 DIAGNOSIS — E785 Hyperlipidemia, unspecified: Secondary | ICD-10-CM

## 2023-08-11 DIAGNOSIS — I10 Essential (primary) hypertension: Secondary | ICD-10-CM

## 2023-08-11 DIAGNOSIS — M79604 Pain in right leg: Secondary | ICD-10-CM

## 2023-08-11 MED ORDER — GABAPENTIN 600 MG PO TABS
600.0000 mg | ORAL_TABLET | Freq: Every day | ORAL | 1 refills | Status: AC
  Filled 2023-11-15: qty 90, 90d supply, fill #0
  Filled 2024-01-17 – 2024-03-18 (×2): qty 90, 90d supply, fill #1

## 2023-08-11 MED ORDER — MELOXICAM 15 MG PO TABS
15.0000 mg | ORAL_TABLET | Freq: Every day | ORAL | 1 refills | Status: AC
  Filled 2023-08-11 – 2023-09-26 (×3): qty 30, 30d supply, fill #0
  Filled 2023-11-15: qty 30, 30d supply, fill #1

## 2023-08-11 MED ORDER — SPIRONOLACTONE 50 MG PO TABS
50.0000 mg | ORAL_TABLET | Freq: Every day | ORAL | 1 refills | Status: AC
Start: 2023-08-11 — End: ?
  Filled 2023-08-11: qty 90, 90d supply, fill #0

## 2023-08-11 NOTE — Progress Notes (Signed)
 Assessment & Plan:   Randall Ferrell was seen today for hypertension.  Diagnoses and all orders for this visit:  Primary hypertension -     spironolactone  (ALDACTONE ) 50 MG tablet; Take 1 tablet (50 mg total) by mouth daily. -     Basic metabolic panel with GFR  Chronic pain of both lower extremities -     gabapentin  (NEURONTIN ) 600 MG tablet; Take 1 tablet (600 mg total) by mouth daily. -     meloxicam  (MOBIC ) 15 MG tablet; Take 1 tablet (15 mg total) by mouth daily. For leg pain  Urine frequency -     PSA  Dyslipidemia, goal LDL below 70 INSTRUCTIONS: Work on a low fat, heart healthy diet and participate in regular aerobic exercise program by working out at least 150 minutes per week; 5 days a week-30 minutes per day. Avoid red meat/beef/steak,  fried foods. junk foods, sodas, sugary drinks, unhealthy snacking, alcohol and smoking.  Drink at least 80 oz of water per day and monitor your carbohydrate intake daily.      Patient has been counseled on age-appropriate routine health concerns for screening and prevention. These are reviewed and up-to-date. Referrals have been placed accordingly. Immunizations are up-to-date or declined.    Subjective:   Chief Complaint  Patient presents with   Hypertension    Randall Ferrell 58 y.o. male presents to office today for follow-up for hypertension.  He has a past medical history of prediabetes and hypertension.   HTN Blood pressure is well-controlled.  He is taking amlodipine , carvedilol  spironolactone  and Diovan  HCT as prescribed.  He endorses intermittent urinary frequency. 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   Review of Systems  Constitutional:  Negative for fever, malaise/fatigue and weight loss.  HENT: Negative.  Negative for nosebleeds.   Eyes: Negative.  Negative for  blurred vision, double vision and photophobia.  Respiratory: Negative.  Negative for cough and shortness of breath.   Cardiovascular: Negative.  Negative for chest pain, palpitations and leg swelling.  Gastrointestinal: Negative.  Negative for heartburn, nausea and vomiting.  Genitourinary:  Positive for frequency. Negative for dysuria, flank pain, hematuria and urgency.  Musculoskeletal: Negative.  Negative for myalgias.  Neurological: Negative.  Negative for dizziness, focal weakness, seizures and headaches.  Psychiatric/Behavioral: Negative.  Negative for suicidal ideas.     Past Medical History:  Diagnosis Date   Heart murmur    Hyperlipidemia    Hypertension    Incomplete right bundle branch block    Left hydrocele     Past Surgical History:  Procedure Laterality Date   CARDIOVASCULAR STRESS TEST  09-22-2014  dr Berry Bristol   normal perfusion study/  normal LV function and wall motion , ef 62%   HYDROCELE EXCISION Left 02/02/2015   Procedure: HYDROCELECTOMY ADULT;  Surgeon: Bart Born, MD;  Location[facility];  Service: Urology;  Laterality: Left;   TRANSTHORACIC ECHOCARDIOGRAM  10-29-2014   dr Berry [place]   moderate concentric LVH, ef 60%/  mild AR, MR, and TR/  IVC borderline dilated w/ respiratory variation    Family History  Problem Relation Age of Onset   Heart disease Father    Heart disease Brother     Social History Reviewed with no changes to be made today.   Outpatient Medications Prior to Visit  Medication Sig Dispense Refill   amLODipine  (NORVASC ) 10 MG tablet Take 1 tablet (10 mg total) by mouth daily. 90 tablet 1   atorvastatin  (LIPITOR) 20 MG tablet Take 1 tablet (20 mg total) by mouth daily. 90 tablet 3   carvedilol  (COREG ) 6.25 MG tablet Take 1 tablet (6.25 mg total) by mouth 2 (two) times daily with a meal. FOR BLOOD PRESSURE 180 tablet 1   valsartan -hydrochlorothiazide  (DIOVAN -HCT) 320-25 MG tablet Take 1 tablet by mouth daily. 90 tablet  3   gabapentin  (NEURONTIN ) 600 MG tablet Take 1 tablet (600 mg total) by mouth daily. 90 tablet 1   meloxicam  (MOBIC ) 15 MG tablet Take 1 tablet (15 mg total) by mouth daily. For leg pain 30 tablet 1   spironolactone  (ALDACTONE ) 50 MG tablet Take 1 tablet (50 mg total) by mouth daily. 90 tablet 1   No facility-administered medications prior to visit.    No Known Allergies     Objective:    BP 125/75 (BP Location: Left Arm)   Pulse 60   Resp 19   Ht 5\' 11"  (1.803 m)   Wt 205 lb (93 kg)   SpO2 100%   BMI 28.59 kg/m  Wt Readings from Last 3 Encounters:  08/11/23 205 lb (93 kg)  05/12/23 211 lb 9.6 oz (96 kg)  02/03/23 206 lb (93.4 kg)    Physical Exam Vitals and nursing note reviewed.  Constitutional:      Appearance: He is well-developed.  HENT:     Head: Normocephalic and atraumatic.  Cardiovascular:     Rate and Rhythm: Normal rate and regular rhythm.     Heart sounds: Murmur heard.     No friction rub. No gallop.  Pulmonary:     Effort: Pulmonary effort is normal. No tachypnea or respiratory distress.     Breath sounds: Normal breath sounds. No decreased breath sounds, wheezing, rhonchi or rales.  Chest:     Chest wall: No tenderness.  Abdominal:     General: Bowel sounds are normal.     Palpations: Abdomen is soft.  Musculoskeletal:        General: Normal range of motion.     Cervical back: Normal range of motion.  Skin:    General: Skin is warm and dry.  Neurological:     Mental Status: He is alert and oriented to person, place, and time.     Coordination: Coordination normal.  Psychiatric:        Behavior: Behavior normal. Behavior is cooperative.        Thought Content: Thought content normal.        Judgment: Judgment normal.          Patient has been counseled extensively about nutrition and exercise as well as the importance of adherence with medications and regular follow-up. The patient was given clear instructions to go to ER or return to  medical center if symptoms don't improve, worsen or new problems develop. The patient verbalized understanding.   Follow-up: Return in about 3 months (around 11/14/2023).   Collins Dean, FNP-BC [facility] [place], [place] [phone]   08/11/2023, 3:56 PM

## 2023-08-12 ENCOUNTER — Ambulatory Visit: Payer: Self-pay | Admitting: Nurse Practitioner

## 2023-08-12 LAB — BASIC METABOLIC PANEL WITH GFR
BUN/Creatinine Ratio: 16 (ref 9–20)
BUN: 16 mg/dL (ref 6–24)
CO2: 23 mmol/L (ref 20–29)
Calcium: 9.7 mg/dL (ref 8.7–10.2)
Chloride: 102 mmol/L (ref 96–106)
Creatinine, Ser: 0.98 mg/dL (ref 0.76–1.27)
Glucose: 132 mg/dL — ABNORMAL HIGH (ref 70–99)
Potassium: 4.6 mmol/L (ref 3.5–5.2)
Sodium: 139 mmol/L (ref 134–144)
eGFR: 89 mL/min/{1.73_m2} (ref >59)

## 2023-08-12 LAB — PSA: Prostate Specific Ag, Serum: 1.4 ng/mL (ref 0.0–4.0)

## 2023-08-14 NOTE — Telephone Encounter (Signed)
 Copied from CRM [id number]. Topic: General - Call Back - No Documentation >> Aug 14, 2023  3:47 PM DeAngela L wrote:  Reason for CRM: Patient returning call and to find out what the office called to inform him about  Pt number [phone] (M)

## 2023-09-26 ENCOUNTER — Other Ambulatory Visit: Payer: Self-pay

## 2023-10-02 ENCOUNTER — Other Ambulatory Visit: Payer: Self-pay

## 2023-11-15 ENCOUNTER — Other Ambulatory Visit: Payer: Self-pay

## 2023-11-16 ENCOUNTER — Other Ambulatory Visit: Payer: Self-pay

## 2023-11-17 ENCOUNTER — Ambulatory Visit: Payer: Self-pay | Admitting: Nurse Practitioner

## 2023-11-17 ENCOUNTER — Other Ambulatory Visit: Payer: Self-pay

## 2023-12-27 ENCOUNTER — Ambulatory Visit: Payer: Self-pay | Admitting: Nurse Practitioner

## 2024-01-17 ENCOUNTER — Other Ambulatory Visit: Payer: Self-pay

## 2024-01-18 ENCOUNTER — Other Ambulatory Visit: Payer: Self-pay

## 2024-01-19 ENCOUNTER — Other Ambulatory Visit: Payer: Self-pay

## 2024-01-19 ENCOUNTER — Encounter: Payer: Self-pay | Admitting: Nurse Practitioner

## 2024-01-19 ENCOUNTER — Ambulatory Visit: Payer: Self-pay | Attending: Nurse Practitioner | Admitting: Nurse Practitioner

## 2024-01-19 DIAGNOSIS — I10 Essential (primary) hypertension: Secondary | ICD-10-CM

## 2024-01-19 MED ORDER — CARVEDILOL 6.25 MG PO TABS
6.2500 mg | ORAL_TABLET | Freq: Two times a day (BID) | ORAL | 1 refills | Status: AC
  Filled 2024-01-19: qty 180, 90d supply, fill #0

## 2024-01-19 NOTE — Progress Notes (Signed)
 Assessment & Plan:  Dyron was seen today for hypertension.  Diagnoses and all orders for this visit:  Primary hypertension -     carvedilol  (COREG ) 6.25 MG tablet; Take 1 tablet (6.25 mg total) by mouth 2 (two) times daily with a meal. FOR BLOOD PRESSURE Hypertension managed with carvedilol  and Crestor. Amlodipine  was not taken as previously thought. - Resume spironolactone  50 mg. - If blood pressure remains elevated after two weeks, start amlodipine . - Continue carvedilol  and Diovan  HCT.   Patient has been counseled on age-appropriate routine health concerns for screening and prevention. These are reviewed and up-to-date. Referrals have been placed accordingly. Immunizations are up-to-date or declined.    Subjective:   Chief Complaint  Patient presents with   Hypertension   History of Present Illness Randall Ferrell is a 58 year old male with hypertension who presents for blood pressure management.  He has been experiencing elevated blood pressure for an unspecified duration. He is currently taking carvedilol  and valsartan  hydrochlorothiazide , but not amlodipine  or spironolactone .   BP Readings from Last 3 Encounters:  01/19/24 (!) 153/103  08/11/23 125/75  05/12/23 124/70    He uses gabapentin  600 mg for leg pain.He also has other pain relievers like ibuprofen and topical creams available.    Review of Systems  Constitutional:  Negative for fever, malaise/fatigue and weight loss.  HENT: Negative.  Negative for nosebleeds.   Eyes: Negative.  Negative for blurred vision, double vision and photophobia.  Respiratory: Negative.  Negative for cough and shortness of breath.   Cardiovascular: Negative.  Negative for chest pain, palpitations and leg swelling.  Gastrointestinal: Negative.  Negative for heartburn, nausea and vomiting.  Musculoskeletal: Negative.  Negative for myalgias.  Neurological: Negative.  Negative for dizziness, focal weakness, seizures and headaches.   Psychiatric/Behavioral: Negative.  Negative for suicidal ideas.     Past Medical History:  Diagnosis Date   Heart murmur    Hyperlipidemia    Hypertension    Incomplete right bundle branch block    Left hydrocele     Past Surgical History:  Procedure Laterality Date   CARDIOVASCULAR STRESS TEST  09-22-2014  dr ladona   normal perfusion study/  normal LV function and wall motion , ef 62%   HYDROCELE EXCISION Left 02/02/2015   Procedure: HYDROCELECTOMY ADULT;  Surgeon: Redell Lynwood Napoleon, MD;  Location: [facility];  Service: Urology;  Laterality: Left;   TRANSTHORACIC ECHOCARDIOGRAM  10-29-2014   dr ladona   moderate concentric LVH, ef 60%/  mild AR, MR, and TR/  IVC borderline dilated w/ respiratory variation    Family History  Problem Relation Age of Onset   Heart disease Father    Heart disease Brother     Social History Reviewed with no changes to be made today.   Outpatient Medications Prior to Visit  Medication Sig Dispense Refill   atorvastatin  (LIPITOR) 20 MG tablet Take 1 tablet (20 mg total) by mouth daily. 90 tablet 3   gabapentin  (NEURONTIN ) 600 MG tablet Take 1 tablet (600 mg total) by mouth daily. 90 tablet 1   meloxicam  (MOBIC ) 15 MG tablet Take 1 tablet (15 mg total) by mouth daily for leg pain 30 tablet 1   valsartan -hydrochlorothiazide  (DIOVAN -HCT) 320-25 MG tablet Take 1 tablet by mouth daily. 90 tablet 3   carvedilol  (COREG ) 6.25 MG tablet Take 1 tablet (6.25 mg total) by mouth 2 (two) times daily with a meal. FOR BLOOD PRESSURE 180 tablet 1  spironolactone  (ALDACTONE ) 50 MG tablet Take 1 tablet (50 mg total) by mouth daily. (Patient not taking: Reported on 01/19/2024) 90 tablet 1   amLODipine  (NORVASC ) 10 MG tablet Take 1 tablet (10 mg total) by mouth daily. (Patient not taking: Reported on 01/19/2024) 90 tablet 1   No facility-administered medications prior to visit.    No Known Allergies     Objective:    BP (!) 153/103 (BP  Location: Left Arm, Patient Position: Sitting, Cuff Size: Normal)   Pulse (!) 55   Resp 18   Ht 5' 11 (1.803 m)   Wt 217 lb (98.4 kg)   SpO2 100%   BMI 30.27 kg/m  Wt Readings from Last 3 Encounters:  01/19/24 217 lb (98.4 kg)  08/11/23 205 lb (93 kg)  05/12/23 211 lb 9.6 oz (96 kg)    Physical Exam Vitals and nursing note reviewed.  Constitutional:      Appearance: He is well-developed.  HENT:     Head: Normocephalic and atraumatic.  Cardiovascular:     Rate and Rhythm: Normal rate and regular rhythm.     Heart sounds: Normal heart sounds. No murmur heard.    No friction rub. No gallop.  Pulmonary:     Effort: Pulmonary effort is normal. No tachypnea or respiratory distress.     Breath sounds: Normal breath sounds. No decreased breath sounds, wheezing, rhonchi or rales.  Chest:     Chest wall: No tenderness.  Musculoskeletal:        General: Normal range of motion.     Cervical back: Normal range of motion.  Skin:    General: Skin is warm and dry.  Neurological:     Mental Status: He is alert and oriented to person, place, and time.     Coordination: Coordination normal.  Psychiatric:        Behavior: Behavior normal. Behavior is cooperative.        Thought Content: Thought content normal.        Judgment: Judgment normal.          Patient has been counseled extensively about nutrition and exercise as well as the importance of adherence with medications and regular follow-up. The patient was given clear instructions to go to ER or return to medical center if symptoms don't improve, worsen or new problems develop. The patient verbalized understanding.   Follow-up: Return in about 3 months (around 04/20/2024).   Haze LELON Servant, FNP-BC [facility] [place], [place] [phone]   01/19/2024, 4:55 PM

## 2024-01-19 NOTE — Patient Instructions (Signed)
 Restart spirinolactone 50mg . After 2 weeks if BP still higher than 130/80 start amlodipine  10mg 

## 2024-01-30 ENCOUNTER — Other Ambulatory Visit: Payer: Self-pay

## 2024-03-18 ENCOUNTER — Other Ambulatory Visit: Payer: Self-pay

## 2024-03-29 ENCOUNTER — Other Ambulatory Visit: Payer: Self-pay

## 2024-04-25 ENCOUNTER — Other Ambulatory Visit (HOSPITAL_BASED_OUTPATIENT_CLINIC_OR_DEPARTMENT_OTHER): Payer: Self-pay

## 2024-04-25 ENCOUNTER — Emergency Department (HOSPITAL_BASED_OUTPATIENT_CLINIC_OR_DEPARTMENT_OTHER)
Admission: EM | Admit: 2024-04-25 | Discharge: 2024-04-25 | Disposition: A | Discharge status: Home / Self Care | Payer: Self-pay | Attending: Emergency Medicine | Admitting: Emergency Medicine

## 2024-04-25 ENCOUNTER — Other Ambulatory Visit: Payer: Self-pay

## 2024-04-25 ENCOUNTER — Encounter (HOSPITAL_BASED_OUTPATIENT_CLINIC_OR_DEPARTMENT_OTHER): Payer: Self-pay

## 2024-04-25 DIAGNOSIS — L03114 Cellulitis of left upper limb: Secondary | ICD-10-CM | POA: Insufficient documentation

## 2024-04-25 DIAGNOSIS — Z79899 Other long term (current) drug therapy: Secondary | ICD-10-CM | POA: Insufficient documentation

## 2024-04-25 DIAGNOSIS — I10 Essential (primary) hypertension: Secondary | ICD-10-CM | POA: Insufficient documentation

## 2024-04-25 MED ORDER — DOXYCYCLINE HYCLATE 100 MG PO CAPS
100.0000 mg | ORAL_CAPSULE | Freq: Two times a day (BID) | ORAL | 0 refills | Status: AC
  Filled 2024-04-25: qty 14, 7d supply, fill #0

## 2024-04-25 MED ORDER — CEPHALEXIN 500 MG PO CAPS
500.0000 mg | ORAL_CAPSULE | Freq: Four times a day (QID) | ORAL | 0 refills | Status: AC
  Filled 2024-04-25: qty 28, 7d supply, fill #0

## 2024-04-25 MED ORDER — DOXYCYCLINE HYCLATE 100 MG PO TABS
100.0000 mg | ORAL_TABLET | Freq: Once | ORAL | Status: AC
  Administered 2024-04-25: 100 mg via ORAL
  Filled 2024-04-25: qty 1

## 2024-04-25 MED ORDER — CEPHALEXIN 250 MG PO CAPS
500.0000 mg | ORAL_CAPSULE | Freq: Once | ORAL | Status: AC
  Administered 2024-04-25: 500 mg via ORAL
  Filled 2024-04-25: qty 2

## 2024-04-25 NOTE — ED Provider Notes (Signed)
 " [facility] AT [facility] [place] Provider Note   CSN: [id number] Arrival date & time: 04/25/24  9043     Patient presents with: Insect Bite   Randall Ferrell is a 59 y.o. male.   Patient with history of high cholesterol, hypertension --presents to the emergency department today for evaluation of left forearm swelling.  Patient reports a bite on the arm about 1 week ago.  Yesterday he developed worsening swelling in the forearm.  He denies fever, nausea, vomiting.  No drainage from the area.  Some soreness noted.  No distal numbness or tingling.       Prior to Admission medications  Medication Sig Start Date End Date Taking? Authorizing Provider  atorvastatin  (LIPITOR) 20 MG tablet Take 1 tablet (20 mg total) by mouth daily. 02/03/23   Fleming, [name] W, NP  carvedilol  (COREG ) 6.25 MG tablet Take 1 tablet (6.25 mg total) by mouth 2 (two) times daily with a meal. FOR BLOOD PRESSURE 01/19/24   Theotis Haze ORN, NP  gabapentin  (NEURONTIN ) 600 MG tablet Take 1 tablet (600 mg total) by mouth daily. 08/11/23   Fleming, [name] W, NP  meloxicam  (MOBIC ) 15 MG tablet Take 1 tablet (15 mg total) by mouth daily for leg pain 08/11/23   Fleming, [name] W, NP  spironolactone  (ALDACTONE ) 50 MG tablet Take 1 tablet (50 mg total) by mouth daily. Patient not taking: Reported on 01/19/2024 08/11/23   Fleming, [name] W, NP  valsartan -hydrochlorothiazide  (DIOVAN -HCT) 320-25 MG tablet Take 1 tablet by mouth daily. 05/12/23   Fleming, [name] W, NP    Allergies: Patient has no known allergies.    Review of Systems  Updated Vital Signs BP (!) 154/98 (BP Location: Right Arm)   Pulse 73   Temp 98.2 F (36.8 C) (Oral)   Resp 18   Wt 93.9 kg   SpO2 99%   BMI 28.87 kg/m   Physical Exam Vitals and nursing note reviewed.  Constitutional:      Appearance: He is well-developed.  HENT:     Head: Normocephalic and atraumatic.  Eyes:     Conjunctiva/sclera: Conjunctivae normal.   Cardiovascular:     Rate and Rhythm: Normal rate and regular rhythm.  Pulmonary:     Effort: No respiratory distress.  Musculoskeletal:     Cervical back: Normal range of motion and neck supple.     Comments: Left forearm, generally swollen, extending to the wrist.  Some erythema, poorly demarcated, on the dorsum of the forearm extending to the dorsum of the hand.  Patient is able to make a fist.  He is able to flex and extend at the wrist and elbow.  No involvement of the upper arm at this time.  No lymphangitis.  There is an area of induration with a small head on this, no active drainage.  Skin:    General: Skin is warm and dry.  Neurological:     Mental Status: He is alert.         (all labs ordered are listed, but only abnormal results are displayed) Labs Reviewed - No data to display  EKG: None  Radiology: No results found.   Procedures   Medications Ordered in the ED  cephALEXin  (KEFLEX ) capsule 500 mg (has no administration in time range)  doxycycline  (VIBRA -TABS) tablet 100 mg (has no administration in time range)   ED Course  Patient seen and examined. History obtained directly from patient.   Labs/EKG: None ordered  Imaging: Ordered.  Bedside ultrasound performed.  EMERGENCY DEPARTMENT US  SOFT TISSUE INTERPRETATION Study: Limited Soft Tissue Ultrasound  INDICATIONS: Soft tissue infection Multiple views of the body part were obtained in real-time with a multi-frequency linear probe  PERFORMED BY: Myself IMAGES ARCHIVED?: Yes SIDE:Left BODY PART:Upper extremity INTERPRETATION:  No abscess noted and cellulitis present  Medications/Fluids: Ordered: Oral doxycycline  and oral Keflex .   Most recent vital signs reviewed and are as follows: BP (!) 154/98 (BP Location: Right Arm)   Pulse 73   Temp 98.2 F (36.8 C) (Oral)   Resp 18   Wt 93.9 kg   SpO2 99%   BMI 28.87 kg/m   Initial impression: Patient with cellulitis of the forearm, no abscess at  this time, but could potentially be in the early stages of forming.  Discussed with patient that he overall looks well.  Good range of motion.  Area of cellulitis is somewhat extensive.  At this point, would start on oral antibiotics.  I do not feel that he is sick enough to warrant admission to the hospital at this time and he is in agreement, but discussed that this area will need to be monitored very closely.  Ideally he would be rechecked in the next 48 to 72 hours.  The weather over the weekend is supposed to be pretty bad and patient would likely not be able to follow-up.  We discussed that if he does have worsening symptoms, to return to the emergency department for reevaluation.  10:57 AM Patient has received meds.   Most current vital signs reviewed and are as follows: BP (!) 154/98 (BP Location: Right Arm)   Pulse 73   Temp 98.2 F (36.8 C) (Oral)   Resp 18   Wt 93.9 kg   SpO2 99%   BMI 28.87 kg/m   Plan: Discharge to home.   Prescriptions written for: doxy, keflex   Other home care instructions discussed: Elevation of extremity, warm compress  ED return instructions discussed: Pt urged to return with worsening pain, worsening swelling, expanding area of redness or streaking up extremity, fever, or any other concerns.  Urged to take complete course of antibiotics as prescribed. Pt verbalizes understanding and agrees with plan.  Follow-up instructions discussed: Patient encouraged to follow-up with their PCP in 2 days for recheck.                                    Medical Decision Making Risk Prescription drug management.   Patient with left forearm and hand cellulitis.  Started with a small papule last week.  Swelling really increased yesterday.  No systemic symptoms such as fever, vomiting.  Vital signs without tachycardia or fever.  Do not suspect sepsis.  Do not suspect necrotizing fasciitis.  Patient has good range of motion of all joints I do not suspect a deep  space infection.  Bedside ultrasound reveals cellulitis without a discrete fluid collection.  Patient started on doxycycline  and Keflex .  Do not feel that he requires admission at this time, but will need close follow-up.  We discussed signs and symptoms to return as above.     Final diagnoses:  Cellulitis of left forearm    ED Discharge Orders          Ordered    doxycycline  (VIBRAMYCIN ) 100 MG capsule  2 times daily        04/25/24 1054    cephALEXin  (KEFLEX ) 500 MG  capsule  4 times daily        04/25/24 1054               Desiderio Chew, PA-C 04/25/24 1101    Ruthe Cornet, DO 04/25/24 1110  "

## 2024-04-25 NOTE — ED Notes (Signed)

## 2024-04-25 NOTE — Discharge Instructions (Signed)
 Please read and follow all provided instructions.  Your diagnoses today include:  1. Cellulitis of left forearm     Tests performed today include: Vital signs. See below for your results today.   Medications prescribed:  Doxycycline  - antibiotic  You have been prescribed an antibiotic medicine: take the entire course of medicine even if you are feeling better. Stopping early can cause the antibiotic not to work.  Keflex  (cephalexin ) - antibiotic  You have been prescribed an antibiotic medicine: take the entire course of medicine even if you are feeling better. Stopping early can cause the antibiotic not to work.  Take any prescribed medications only as directed.   Home care instructions:  Follow any educational materials contained in this packet. Keep affected area above the level of your heart when possible. Wash area gently twice a day with warm soapy water. Do not apply alcohol or hydrogen peroxide. Cover the area if it draining or weeping.   Follow-up instructions: Please follow-up with your provider in 48 hours or return to the emergency department for recheck.  Currently you have cellulitis without a fluid collection, but sometimes these infections can progress and you may develop an abscess which will need drainage at a later time.  Return instructions:  Return to the Emergency Department sooner if you have: Fever Worsening pain Worsening swelling Redness of the skin that moves away from the affected area, especially if it streaks away from the affected area  Any other emergent concerns  Your vital signs today were: BP (!) 154/98 (BP Location: Right Arm)   Pulse 73   Temp 98.2 F (36.8 C) (Oral)   Resp 18   Wt 93.9 kg   SpO2 99%   BMI 28.87 kg/m  If your blood pressure (BP) was elevated above 135/85 this visit, please have this repeated by your doctor within one month. --------------

## 2024-04-25 NOTE — ED Triage Notes (Addendum)
 Pt ambulatory to triage . Reports possible spider bite to left arm last week . Arm and hand swollen with redness to forearm. Warm to touch

## 2024-04-26 ENCOUNTER — Ambulatory Visit: Payer: Self-pay | Admitting: Nurse Practitioner

## 2024-05-10 ENCOUNTER — Ambulatory Visit: Payer: Self-pay | Admitting: Nurse Practitioner

## 2024-06-07 ENCOUNTER — Ambulatory Visit: Payer: Self-pay | Admitting: Nurse Practitioner
